# Patient Record
Sex: Male | Born: 1955 | Race: White | Hispanic: No | Marital: Married | Smoking: Former smoker
Health system: Southern US, Community
[De-identification: ages and names within clinical notes are randomized; demographics above are authoritative.]

## PROBLEM LIST (undated history)

## (undated) DIAGNOSIS — K219 Gastro-esophageal reflux disease without esophagitis: Secondary | ICD-10-CM

## (undated) DIAGNOSIS — G4730 Sleep apnea, unspecified: Secondary | ICD-10-CM

## (undated) DIAGNOSIS — I10 Essential (primary) hypertension: Secondary | ICD-10-CM

## (undated) DIAGNOSIS — Z9889 Other specified postprocedural states: Secondary | ICD-10-CM

## (undated) HISTORY — DX: Other specified postprocedural states: Z98.890

## (undated) HISTORY — DX: Gastro-esophageal reflux disease without esophagitis: K21.9

## (undated) HISTORY — PX: BACK SURGERY: SHX140

## (undated) HISTORY — PX: LUMBAR DISC SURGERY: SHX700

## (undated) HISTORY — PX: LUMBAR FUSION: SHX111

## (undated) HISTORY — DX: Sleep apnea, unspecified: G47.30

## (undated) HISTORY — DX: Essential (primary) hypertension: I10

---

## 2000-08-01 HISTORY — PX: RIB FRACTURE SURGERY: SHX2358

## 2010-08-01 DIAGNOSIS — G4730 Sleep apnea, unspecified: Secondary | ICD-10-CM

## 2010-08-01 HISTORY — DX: Sleep apnea, unspecified: G47.30

## 2010-11-18 ENCOUNTER — Other Ambulatory Visit: Payer: Self-pay | Admitting: Internal Medicine

## 2010-11-18 ENCOUNTER — Ambulatory Visit
Admission: RE | Admit: 2010-11-18 | Discharge: 2010-11-18 | Disposition: A | Discharge status: Home / Self Care | Payer: Worker's Compensation | Source: Ambulatory Visit | Attending: Internal Medicine | Admitting: Internal Medicine

## 2010-11-18 DIAGNOSIS — R0789 Other chest pain: Secondary | ICD-10-CM

## 2010-11-18 MED ORDER — IOHEXOL 300 MG/ML  SOLN: 125.0000 mL | Freq: Once | INTRAMUSCULAR | Status: AC | PRN

## 2014-12-25 HISTORY — PX: ROTATOR CUFF REPAIR: SHX139

## 2015-02-04 ENCOUNTER — Ambulatory Visit: Payer: Worker's Compensation | Attending: Orthopaedic Surgery | Admitting: Physical Therapy

## 2015-02-04 DIAGNOSIS — M25511 Pain in right shoulder: Secondary | ICD-10-CM | POA: Insufficient documentation

## 2015-02-04 DIAGNOSIS — M25611 Stiffness of right shoulder, not elsewhere classified: Secondary | ICD-10-CM | POA: Diagnosis not present

## 2015-02-04 NOTE — Therapy (Signed)
[facility] [address] [place], [place], [address] Phone: [phone]   Fax:  [phone]  Physical Therapy Evaluation  Patient Details  Name: Jeffrey Logan MRN: [id number] Date of Birth: 08/02/1955 Referring Provider:  Marcene Corningalldorf, Peter, MD  Encounter Date: 02/04/2015      PT End of Session - 02/04/15 1135    Visit Number 1   Number of Visits 18   Date for PT Re-Evaluation 04/01/15   PT Start Time 0858   PT Stop Time 0943   PT Time Calculation (min) 45 min   Activity Tolerance Patient tolerated treatment well   Behavior During Therapy [facility] for tasks assessed/performed      No past medical history on file.  No past surgical history on file.  There were no vitals filed for this visit.  Visit Diagnosis:  Right shoulder pain - Plan: PT plan of care cert/re-cert  Shoulder stiffness, right - Plan: PT plan of care cert/re-cert      Subjective Assessment - 02/04/15 1117    Subjective I know this will be a long process.   Limitations Lifting   Patient Stated Goals Return right shoulder to previous level of function.   Currently in Pain? Yes   Pain Score 6    Pain Location Shoulder   Pain Orientation Right   Pain Descriptors / Indicators Aching;Constant   Pain Type Surgical pain   Pain Onset More than a month ago   Pain Frequency Constant   Aggravating Factors  Moving right UE.   Pain Relieving Factors Not moving right UE.   Multiple Pain Sites No            [facility] PT Assessment - 02/04/15 0001    Assessment   Medical Diagnosis Right shoulder RCR.   Onset Date/Surgical Date --  12/25/14.   Precautions   Precaution Comments Begin with gentle right shoulder range of motion.   Restrictions   Weight Bearing Restrictions No   Balance Screen   Has the patient fallen in the past 6 months No   Has the patient had a decrease in activity level because of a fear of falling?  No   Is the patient reluctant to leave their home  because of a fear of falling?  No   Home [employer]   Living Environment Private residence   Prior Function   Level of Independence Independent   Posture/Postural Control   Posture Comments Guarded posture.   ROM / Strength   AROM / PROM / Strength AROM   AROM   Overall AROM Comments Passive-assisitve right shoulder flexion= 90 degrees and ER= 50 degrees.   Palpation   Palpation comment --  Patient c/o diffuse right shoulder pain.                   [facility] - 02/04/15 0001    Modalities   Modalities Electrical Stimulation   Electrical Stimulation   Electrical Stimulation Location --  Right shoulder.   Electrical Stimulation Action --  80-150 HZ at 100% scan--Non-motoric x 15 minutes.   Electrical Stimulation Goals Pain                     PT Long Term Goals - 02/04/15 1143    PT LONG TERM GOAL #1   Title Ind with a HEP.   Time 6   Period Weeks   Status New   PT LONG TERM GOAL #2   Title Active  right shoulder flexion to 155 degrees+.   Time 6   Period Weeks   Status New   PT LONG TERM GOAL #3   Title Active ER to 80 degrees.   Time 6   Period Weeks   Status New   PT LONG TERM GOAL #4   Title Patient reach behind back to L2 with right hand.   Time 6   Period Weeks   Status New   PT LONG TERM GOAL #5   Title Right shoulder strength= 5/5.   Time 6   Period Weeks   Status New   Additional Long Term Goals   Additional Long Term Goals Yes   PT LONG TERM GOAL #6   Title Patient able to perform ADL's with right shoulder pain not > 3/10.               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  He underwent a right RCR repair surgery on 12/25/14.  He is still complaint to his sling usage.  He is doing the pendulum exercise at home.  The patient's pain-level is rated at a 5-6/10 and higher (7+/10) with movement out of  sling.   Pt will benefit from skilled therapeutic intervention in order to improve on the following deficits Pain;Decreased activity tolerance;Decreased range of motion;Decreased strength   Rehab Potential Excellent   PT Frequency 3x / week   PT Duration 6 weeks   PT Treatment/Interventions ADLs/Self Care Home Management;Cryotherapy;Electrical Stimulation;Moist Heat;Ultrasound;Therapeutic activities;Therapeutic exercise;Neuromuscular re-education;Patient/family education;Manual techniques;Passive range of motion   PT Next Visit Plan Begin with P and Passive-assisitve ROM to patient's right shoulder to restore full right shoulder ROM.  Modalties and STM PRN.         Problem List There are no active problems to display for this patient.   Eddrick Dilone, [place] MPT 02/04/2015, 11:50 AM  [facility] [address] [place], [place], [address] Phone: [phone]   Fax:  [phone]

## 2015-02-06 ENCOUNTER — Ambulatory Visit: Payer: Worker's Compensation | Admitting: Physical Therapy

## 2015-02-06 DIAGNOSIS — M25511 Pain in right shoulder: Secondary | ICD-10-CM

## 2015-02-06 DIAGNOSIS — M25611 Stiffness of right shoulder, not elsewhere classified: Secondary | ICD-10-CM

## 2015-02-06 NOTE — Therapy (Signed)
[facility] [address] [place], [place], [address] Phone: [phone]   Fax:  [phone]  Physical Therapy Treatment  Patient Details  Name: Meril Dray MRN: [id number] Date of Birth: 07/03/1956 Referring Provider:  Marcene Corning, MD  Encounter Date: 02/06/2015      PT End of Session - 02/06/15 0931    Visit Number 2   Number of Visits 18   Date for PT Re-Evaluation 04/01/15   PT Start Time 0903   PT Stop Time 0943   PT Time Calculation (min) 40 min   Equipment Utilized During Treatment Other (comment)  R shoulder sling   Activity Tolerance Patient limited by pain   Behavior During Therapy [facility] for tasks assessed/performed      No past medical history on file.  No past surgical history on file.  There were no vitals filed for this visit.  Visit Diagnosis:  Right shoulder pain  Shoulder stiffness, right      Subjective Assessment - 02/06/15 0932    Subjective Reports some pain that goes into his neck at a constant rate he says. States that the stim machine made his shoulder sore the following day after evaluation. Took pain meds prior to therapy. States that sometimes he takes his arm out of the sling for a little while and lets RUE just lay due to sling making his shoulder hurt sometimes.   Patient Stated Goals Return right shoulder to previous level of function.   Currently in Pain? Yes   Pain Score 5    Pain Location Shoulder   Pain Orientation Right   Pain Descriptors / Indicators Sore   Pain Type Surgical pain   Pain Onset More than a month ago   Pain Frequency Constant            [facility] PT Assessment - 02/06/15 0001    Assessment   Medical Diagnosis Right shoulder RCR.   Onset Date/Surgical Date 12/25/14   Next MD Visit 02/27/2015                     [facility] Adult PT Treatment/Exercise - 02/06/15 0001    Modalities   Modalities Electrical Stimulation;Vasopneumatic   Electrical Stimulation    Electrical Stimulation Location R shoulder   Electrical Stimulation Action IFC   Electrical Stimulation Parameters 1-10 Hz x15 min   Electrical Stimulation Goals Pain   Vasopneumatic   Number Minutes Vasopneumatic  15 minutes   Vasopnuematic Location  Shoulder   Vasopneumatic Pressure Medium   Vasopneumatic Temperature  48   Manual Therapy   Manual Therapy Passive ROM   Passive ROM R shoulder into flex/scap/ER/IR with gentle holds at end range                     PT Long Term Goals - 02/04/15 1143    PT LONG TERM GOAL #1   Title Ind with a HEP.   Time 6   Period Weeks   Status New   PT LONG TERM GOAL #2   Title Active right shoulder flexion to 155 degrees+.   Time 6   Period Weeks   Status New   PT LONG TERM GOAL #3   Title Active ER to 80 degrees.   Time 6   Period Weeks   Status New   PT LONG TERM GOAL #4   Title Patient reach behind back to L2 with right hand.   Time 6   Period Weeks  Status New   PT LONG TERM GOAL #5   Title Right shoulder strength= 5/5.   Time 6   Period Weeks   Status New   Additional Long Term Goals   Additional Long Term Goals Yes   PT LONG TERM GOAL #6   Title Patient able to perform ADL's with right shoulder pain not > 3/10.               Plan - 02/06/15 0937    Clinical Impression Statement The patient did fairly well with PROM treatment today but had pain at end range and continues to have pain on descent during PROM. All goals remain on-going secondary to PROM treatment limitation. Firm end feels noted during PROM of the R shoulder but occassionally had empty end feels. Oscillation used frequently during manual therapy. Stim/vaso were terminated after 11 minutes secondary to patient request in what he reported as discomfort with the medium setting with vasopneumatic. E-stim was set non-motoric to where patient first felt tingle sensation. Experienced 8/10 soreness following treatment,   Pt will benefit from  skilled therapeutic intervention in order to improve on the following deficits Pain;Decreased activity tolerance;Decreased range of motion;Decreased strength   Rehab Potential Excellent   PT Frequency 3x / week   PT Duration 6 weeks   PT Treatment/Interventions ADLs/Self Care Home Management;Cryotherapy;Electrical Stimulation;Moist Heat;Ultrasound;Therapeutic activities;Therapeutic exercise;Neuromuscular re-education;Patient/family education;Manual techniques;Passive range of motion   PT Next Visit Plan Continue per PROM per MPT POC.   Consulted and Agree with Plan of Care Patient        Problem List There are no active problems to display for this patient.   Evelene CroonKelsey M Parsons, PTA 02/06/2015, 10:02 AM  [facility] [address] [place], [place], [address] Phone: [phone]   Fax:  [phone]

## 2015-02-09 ENCOUNTER — Encounter: Payer: Self-pay | Admitting: Physical Therapy

## 2015-02-09 ENCOUNTER — Ambulatory Visit: Payer: Worker's Compensation | Admitting: Physical Therapy

## 2015-02-09 DIAGNOSIS — M25511 Pain in right shoulder: Secondary | ICD-10-CM | POA: Diagnosis not present

## 2015-02-09 DIAGNOSIS — M25611 Stiffness of right shoulder, not elsewhere classified: Secondary | ICD-10-CM

## 2015-02-09 NOTE — Therapy (Signed)
[facility] [address] [place], [place], [address] Phone: [phone]   Fax:  [phone]  Physical Therapy Treatment  Patient Details  Name: Jeffrey Logan MRN: [id number] Date of Birth: July 03, 1956 Referring Provider:  Marcene Corning, MD  Encounter Date: 02/09/2015      PT End of Session - 02/09/15 1011    Visit Number 3   Number of Visits 18   Date for PT Re-Evaluation 04/01/15   PT Start Time 0945   PT Stop Time 1032   PT Time Calculation (min) 47 min   Activity Tolerance Patient tolerated treatment well   Behavior During Therapy [facility] for tasks assessed/performed      History reviewed. No pertinent past medical history.  History reviewed. No pertinent past surgical history.  There were no vitals filed for this visit.  Visit Diagnosis:  Right shoulder pain  Shoulder stiffness, right      Subjective Assessment - 02/09/15 0957    Subjective sore the past few days up to 8/10 and has been having difficulty taking shower and used right UE   Limitations Lifting   Patient Stated Goals Return right shoulder to previous level of function.   Currently in Pain? Yes   Pain Score 7    Pain Location Shoulder   Pain Orientation Right   Pain Descriptors / Indicators Sore   Pain Type Surgical pain   Pain Onset More than a month ago   Pain Frequency Constant   Aggravating Factors  moving right arm   Pain Relieving Factors rest            [facility] PT Assessment - 02/09/15 0001    ROM / Strength   AROM / PROM / Strength PROM   PROM   Overall PROM  Deficits   PROM Assessment Site Shoulder   Right/Left Shoulder Right   Right Shoulder Flexion 114 Degrees   Right Shoulder External Rotation 62 Degrees                     [facility] Adult PT Treatment/Exercise - 02/09/15 0001    Electrical Stimulation   Electrical Stimulation Location R shoulder   Electrical Stimulation Action IFC   Electrical Stimulation Parameters  1-10hz    Electrical Stimulation Goals Pain   Vasopneumatic   Number Minutes Vasopneumatic  15 minutes   Vasopnuematic Location  Shoulder   Vasopneumatic Pressure Low   Manual Therapy   Manual Therapy Passive ROM   Passive ROM R shoulder into flex/scap/ER/IR with gentle holds at end range                     PT Long Term Goals - 02/04/15 1143    PT LONG TERM GOAL #1   Title Ind with a HEP.   Time 6   Period Weeks   Status New   PT LONG TERM GOAL #2   Title Active right shoulder flexion to 155 degrees+.   Time 6   Period Weeks   Status New   PT LONG TERM GOAL #3   Title Active ER to 80 degrees.   Time 6   Period Weeks   Status New   PT LONG TERM GOAL #4   Title Patient reach behind back to L2 with right hand.   Time 6   Period Weeks   Status New   PT LONG TERM GOAL #5   Title Right shoulder strength= 5/5.   Time 6   Period Weeks  Status New   Additional Long Term Goals   Additional Long Term Goals Yes   PT LONG TERM GOAL #6   Title Patient able to perform ADL's with right shoulder pain not > 3/10.               Plan - 02/09/15 1025    Clinical Impression Statement Patient progressing with PROM today. Patient reported using arm with showering. Patient continues to wear sling and is unsure of when MD wants him to DC sling. Goals ongoing due to protocol limitations.    Pt will benefit from skilled therapeutic intervention in order to improve on the following deficits Pain;Decreased activity tolerance;Decreased range of motion;Decreased strength   Rehab Potential Excellent   Clinical Impairments Affecting Rehab Potential surgury 12/25/14 current 6 weeks 02/05/15   PT Frequency 3x / week   PT Duration 6 weeks   PT Treatment/Interventions ADLs/Self Care Home Management;Cryotherapy;Electrical Stimulation;Moist Heat;Ultrasound;Therapeutic activities;Therapeutic exercise;Neuromuscular re-education;Patient/family education;Manual techniques;Passive range of  motion   PT Next Visit Plan Continue per PROM per MPT POC. (MD Yisroel Rammingaldorf 02/25/15)   Consulted and Agree with Plan of Care Patient        Problem List There are no active problems to display for this patient.   Hermelinda DellenDUNFORD, Monicka Cyran P, PTA 02/09/2015, 10:39 AM  [facility] [address] [place], [place], [id number] Phone: [phone]   Fax:  [phone]

## 2015-02-11 ENCOUNTER — Ambulatory Visit: Payer: Worker's Compensation | Admitting: Physical Therapy

## 2015-02-11 ENCOUNTER — Encounter: Payer: Self-pay | Admitting: Physical Therapy

## 2015-02-11 DIAGNOSIS — M25511 Pain in right shoulder: Secondary | ICD-10-CM | POA: Diagnosis not present

## 2015-02-11 DIAGNOSIS — M25611 Stiffness of right shoulder, not elsewhere classified: Secondary | ICD-10-CM

## 2015-02-11 NOTE — Therapy (Signed)
[facility] [address] [place], [place], [address] Phone: [phone]   Fax:  [phone]  Physical Therapy Treatment  Patient Details  Name: Jeffrey Logan MRN: [id number] Date of Birth: 02/18/1956 Referring Provider:  Marcene Corningalldorf, Peter, MD  Encounter Date: 02/11/2015      PT End of Session - 02/11/15 1010    Visit Number 4   Number of Visits 18   Date for PT Re-Evaluation 04/01/15   PT Start Time 0945   PT Stop Time 1025   PT Time Calculation (min) 40 min   Activity Tolerance Patient tolerated treatment well   Behavior During Therapy [facility] for tasks assessed/performed      History reviewed. No pertinent past medical history.  History reviewed. No pertinent past surgical history.  There were no vitals filed for this visit.  Visit Diagnosis:  Right shoulder pain  Shoulder stiffness, right      Subjective Assessment - 02/11/15 0949    Subjective ache in right shoulder today   Limitations Lifting   Patient Stated Goals Return right shoulder to previous level of function.   Currently in Pain? Yes   Pain Score 7    Pain Location Shoulder   Pain Orientation Right   Pain Descriptors / Indicators Sore   Pain Type Surgical pain   Pain Onset More than a month ago   Pain Frequency Constant   Aggravating Factors  moving arm   Pain Relieving Factors rest            [facility] PT Assessment - 02/11/15 0001    PROM   Overall PROM  Deficits   PROM Assessment Site Shoulder   Right/Left Shoulder Right   Right Shoulder External Rotation 61 Degrees                     [facility] Adult PT Treatment/Exercise - 02/11/15 0001    Electrical Stimulation   Electrical Stimulation Location R shoulder   Electrical Stimulation Action premod non motoric   Electrical Stimulation Parameters 1-10HZ    Electrical Stimulation Goals Pain   Vasopneumatic   Number Minutes Vasopneumatic  15 minutes   Vasopnuematic Location  Shoulder    Vasopneumatic Pressure Low   Manual Therapy   Manual Therapy Passive ROM   Passive ROM R shoulder into flex/scap/ER/IR with gentle holds at end range                     PT Long Term Goals - 02/04/15 1143    PT LONG TERM GOAL #1   Title Ind with a HEP.   Time 6   Period Weeks   Status New   PT LONG TERM GOAL #2   Title Active right shoulder flexion to 155 degrees+.   Time 6   Period Weeks   Status New   PT LONG TERM GOAL #3   Title Active ER to 80 degrees.   Time 6   Period Weeks   Status New   PT LONG TERM GOAL #4   Title Patient reach behind back to L2 with right hand.   Time 6   Period Weeks   Status New   PT LONG TERM GOAL #5   Title Right shoulder strength= 5/5.   Time 6   Period Weeks   Status New   Additional Long Term Goals   Additional Long Term Goals Yes   PT LONG TERM GOAL #6   Title Patient able to perform ADL's with  right shoulder pain not > 3/10.               Plan - 02/11/15 1011    Clinical Impression Statement Pateint progressing with ROM slowly today. No improvemt with measurements today due to soreness. Pateint ran out of pain meds and has increased throbbing in right shoulder. Goals ongoing due to protocol limitations.   Pt will benefit from skilled therapeutic intervention in order to improve on the following deficits Pain;Decreased activity tolerance;Decreased range of motion;Decreased strength   Rehab Potential Excellent   Clinical Impairments Affecting Rehab Potential surgury 12/25/14 current 6 weeks 02/05/15   PT Frequency 3x / week   PT Duration 6 weeks   PT Treatment/Interventions ADLs/Self Care Home Management;Cryotherapy;Electrical Stimulation;Moist Heat;Ultrasound;Therapeutic activities;Therapeutic exercise;Neuromuscular re-education;Patient/family education;Manual techniques;Passive range of motion   PT Next Visit Plan Continue per PROM per MPT POC. (MD [name] 02/25/15)   Consulted and Agree with Plan of Care Patient         Problem List There are no active problems to display for this patient.   [name], PTA 02/11/2015, 10:27 AM  [facility] [address] [place], [place], [address] Phone: [phone]   Fax:  [phone]

## 2015-02-13 ENCOUNTER — Ambulatory Visit: Payer: Worker's Compensation | Admitting: Physical Therapy

## 2015-02-13 DIAGNOSIS — M25611 Stiffness of right shoulder, not elsewhere classified: Secondary | ICD-10-CM

## 2015-02-13 DIAGNOSIS — M25511 Pain in right shoulder: Secondary | ICD-10-CM | POA: Diagnosis not present

## 2015-02-13 NOTE — Therapy (Signed)
[facility] [address] [place], [place], [address] Phone: [phone]   Fax:  [phone]  Physical Therapy Treatment  Patient Details  Name: Jeffrey Logan MRN: [id number] Date of Birth: 27-Jan-1956 Referring Provider:  Marcene Corning, MD  Encounter Date: 02/13/2015      PT End of Session - 02/13/15 0948    Visit Number 5   Number of Visits 18   Date for PT Re-Evaluation 04/01/15   PT Start Time 0945   PT Stop Time 1043   PT Time Calculation (min) 58 min   Activity Tolerance Patient tolerated treatment well   Behavior During Therapy [facility] for tasks assessed/performed      No past medical history on file.  No past surgical history on file.  There were no vitals filed for this visit.  Visit Diagnosis:  Right shoulder pain  Shoulder stiffness, right      Subjective Assessment - 02/13/15 0950    Subjective Always bothering me. Patient reports difficulty sleeping.   Currently in Pain? Yes   Pain Score 6    Pain Location Shoulder   Pain Orientation Right   Pain Descriptors / Indicators Throbbing   Pain Type Surgical pain   Aggravating Factors  moving arm   Pain Relieving Factors rest   Effect of Pain on Daily Activities diffictulty sleeping            [facility] PT Assessment - 02/13/15 0001    Assessment   Medical Diagnosis Right shoulder RCR.   Onset Date/Surgical Date 12/25/14   Next MD Visit 02/27/2015   PROM   PROM Assessment Site Shoulder   Right/Left Shoulder Right   Right Shoulder Flexion 125 Degrees   Right Shoulder External Rotation 49 Degrees   Flexibility   Soft Tissue Assessment /Muscle Length --  marked tightness in Rt teres minor/major                     [facility] Adult PT Treatment/Exercise - 02/13/15 0001    Electrical Stimulation   Electrical Stimulation Location R shoulder   Electrical Stimulation Action IFC   Electrical Stimulation Parameters 80-150 HZ   Electrical Stimulation Goals  Pain   Vasopneumatic   Number Minutes Vasopneumatic  15 minutes   Vasopnuematic Location  Shoulder   Vasopneumatic Pressure Medium   Manual Therapy   Manual Therapy Soft tissue mobilization;Myofascial release;Passive ROM   Soft tissue mobilization Rt post cuff   Myofascial Release TPR to post cuff   Passive ROM R shoulder into flex/scap/ER/IR with gentle holds at end range                     PT Long Term Goals - 02/13/15 1247    PT LONG TERM GOAL #1   Title Ind with a HEP.   Time 6   Period Weeks   Status On-going   PT LONG TERM GOAL #2   Title Active right shoulder flexion to 155 degrees+.   Time 6   Period Weeks   Status On-going   PT LONG TERM GOAL #3   Title Active ER to 80 degrees.   Time 6   Period Weeks   Status On-going   PT LONG TERM GOAL #4   Title Patient reach behind back to L2 with right hand.   Time 6   Period Weeks   Status On-going   PT LONG TERM GOAL #5   Title Right shoulder strength= 5/5.   Period  Weeks   Status On-going   PT LONG TERM GOAL #6   Title Patient able to perform ADL's with right shoulder pain not > 3/10.   Time 6   Period Weeks   Status On-going               Plan - 02/13/15 1246    Clinical Impression Statement Patient showed decreased ROM in ER today. He was limited by pain. He has active TPs and increased tone in the Teres muscles which improved after manual therapy. He has gained PROM in flexion.   PT Next Visit Plan Continue per PROM per MPT POC. (MD [facility] 02/25/15)        Problem List There are no active problems to display for this patient.  Solon PalmJulie Jaser Fullen PT  02/13/2015, 12:49 PM  [facility] [address] [place], [place], [address] Phone: [phone]   Fax:  [phone]

## 2015-02-16 ENCOUNTER — Ambulatory Visit: Payer: Worker's Compensation | Admitting: Physical Therapy

## 2015-02-16 DIAGNOSIS — M25611 Stiffness of right shoulder, not elsewhere classified: Secondary | ICD-10-CM

## 2015-02-16 DIAGNOSIS — M25511 Pain in right shoulder: Secondary | ICD-10-CM | POA: Diagnosis not present

## 2015-02-16 NOTE — Therapy (Signed)
[facility] [address] [place], [place], [address] Phone: [phone]   Fax:  [phone]  Physical Therapy Treatment  Patient Details  Name: Jeffrey Logan MRN: [id number] Date of Birth: 1956/06/15 Referring Provider:  Marcene Corning, MD  Encounter Date: 02/16/2015      PT End of Session - 02/16/15 1516    Visit Number 6   Number of Visits 18   Date for PT Re-Evaluation 04/01/15   PT Start Time 1515   PT Stop Time 1559   PT Time Calculation (min) 44 min      No past medical history on file.  No past surgical history on file.  There were no vitals filed for this visit.  Visit Diagnosis:  Right shoulder pain  Shoulder stiffness, right      Subjective Assessment - 02/16/15 1515    Subjective Reports that he had increased pain yesterday. Has not been doing much today so he doesn't have much pain.   Limitations Lifting   Patient Stated Goals Return right shoulder to previous level of function.   Currently in Pain? Yes   Pain Score 5    Pain Location Shoulder   Pain Orientation Right   Pain Descriptors / Indicators Throbbing   Pain Type Surgical pain   Pain Onset More than a month ago   Pain Frequency Intermittent            [facility] PT Assessment - 02/16/15 0001    Assessment   Medical Diagnosis Right shoulder RCR.   Onset Date/Surgical Date 12/25/14   Next MD Visit 02/27/2015                     [facility] Adult PT Treatment/Exercise - 02/16/15 0001    Modalities   Modalities Electrical Stimulation;Vasopneumatic   Electrical Stimulation   Electrical Stimulation Location R shoulder   Electrical Stimulation Action IFC    Electrical Stimulation Parameters 1-10 Hz x15 min   Electrical Stimulation Goals Pain   Vasopneumatic   Number Minutes Vasopneumatic  15 minutes   Vasopnuematic Location  Shoulder   Vasopneumatic Pressure Low   Manual Therapy   Manual Therapy Passive ROM   Passive ROM R shoulder into  flex/scap/ER/IR with gentle holds at end range                     PT Long Term Goals - 02/13/15 1247    PT LONG TERM GOAL #1   Title Ind with a HEP.   Time 6   Period Weeks   Status On-going   PT LONG TERM GOAL #2   Title Active right shoulder flexion to 155 degrees+.   Time 6   Period Weeks   Status On-going   PT LONG TERM GOAL #3   Title Active ER to 80 degrees.   Time 6   Period Weeks   Status On-going   PT LONG TERM GOAL #4   Title Patient reach behind back to L2 with right hand.   Time 6   Period Weeks   Status On-going   PT LONG TERM GOAL #5   Title Right shoulder strength= 5/5.   Period Weeks   Status On-going   PT LONG TERM GOAL #6   Title Patient able to perform ADL's with right shoulder pain not > 3/10.   Time 6   Period Weeks   Status On-going  Plan - 02/16/15 1556    Clinical Impression Statement Patient did fairly well during PROM but continues to have pain following several minutes of motion into each direction and catching. Oscillations were conducted to RUE to relax the R shoulder and to assist in decreasing pain. Palpated the R teres region that had increased tone in previous treatment but TPR was not needed today secondary to normal tone palpated. Normal modalties response noted following removal of the modalities. No goals were achieved today secondary to decreased ROM, strength, and increased pain. Reported soreness following treatment but no numerical rating for pain if pain was experienced.   Pt will benefit from skilled therapeutic intervention in order to improve on the following deficits Pain;Decreased activity tolerance;Decreased range of motion;Decreased strength   Rehab Potential Excellent   Clinical Impairments Affecting Rehab Potential surgury 12/25/14 current 6 weeks 02/05/15   PT Frequency 3x / week   PT Duration 6 weeks   PT Treatment/Interventions ADLs/Self Care Home Management;Cryotherapy;Electrical  Stimulation;Moist Heat;Ultrasound;Therapeutic activities;Therapeutic exercise;Neuromuscular re-education;Patient/family education;Manual techniques;Passive range of motion   PT Next Visit Plan Continue per PROM per MPT POC. (MD Yisroel Rammingaldorf 02/25/15)   Consulted and Agree with Plan of Care Patient        Problem List There are no active problems to display for this patient.   Evelene CroonKelsey M Parsons, PTA 02/16/2015, 4:06 PM  [facility] [address] [place], [place], [address] Phone: [phone]   Fax:  [phone]

## 2015-02-18 ENCOUNTER — Ambulatory Visit: Payer: Worker's Compensation | Admitting: Physical Therapy

## 2015-02-18 DIAGNOSIS — M25611 Stiffness of right shoulder, not elsewhere classified: Secondary | ICD-10-CM

## 2015-02-18 DIAGNOSIS — M25511 Pain in right shoulder: Secondary | ICD-10-CM

## 2015-02-18 NOTE — Therapy (Signed)
[facility] [address] [place], [place], [address] Phone: [phone]   Fax:  [phone]  Physical Therapy Treatment  Patient Details  Name: Jeffrey [place] MRN: [id number] Date of Birth: 08/19/1955 Referring Provider:  Marcene Corningalldorf, Peter, MD  Encounter Date: 02/18/2015      PT End of Session - 02/18/15 1340    Visit Number 7   Number of Visits 18   Date for PT Re-Evaluation 04/01/15   PT Start Time 0100   PT Stop Time 0149   PT Time Calculation (min) 49 min   Activity Tolerance Patient tolerated treatment well   Behavior [facility] Therapy [facility] for tasks assessed/performed      No past medical history on file.  No past surgical history on file.  There were no vitals filed for this visit.  Visit Diagnosis:  Right shoulder pain  Shoulder stiffness, right      Subjective Assessment - 02/18/15 1338    Subjective pain around a 5/10.  Going to doctor next Wednesday (02/25/15).   Pain Score 5    Pain Location Shoulder   Pain Orientation Right   Pain Descriptors / Indicators Throbbing   Pain Type Surgical pain   Pain Onset More than a month ago                         [facility] Adult [facility] Treatment/Exercise - 02/18/15 0001    Electrical Stimulation   Electrical Stimulation Location Right shoulder.   Electrical Stimulation Action IFC at 100% scan   Electrical Stimulation Parameters 80-150 HZ x 15 minutes.   Electrical Stimulation Goals Pain   Manual Therapy   Manual therapy comments PROM in supine into right shoulder flexion and ER in plane of scapula x 23 minutes.                     PT Long Term Goals - 02/13/15 1247    PT LONG TERM GOAL #1   Title Ind with a HEP.   Time 6   Period Weeks   Status On-going   PT LONG TERM GOAL #2   Title Active right shoulder flexion to 155 degrees+.   Time 6   Period Weeks   Status On-going   PT LONG TERM GOAL #3   Title Active ER to 80 degrees.   Time 6   Period Weeks   Status On-going   PT LONG TERM GOAL #4   Title Patient reach behind back to L2 with right hand.   Time 6   Period Weeks   Status On-going   PT LONG TERM GOAL #5   Title Right shoulder strength= 5/5.   Period Weeks   Status On-going   PT LONG TERM GOAL #6   Title Patient able to perform ADL's with right shoulder pain not > 3/10.   Time 6   Period Weeks   Status On-going               Problem List There are no active problems to display for this patient.   Jeffrey Logan, [place] MPT 02/18/2015, 1:53 PM  [facility] [address] [place], [address] Phone: [phone]   Fax:  [phone]

## 2015-02-20 ENCOUNTER — Ambulatory Visit: Payer: Worker's Compensation | Admitting: Physical Therapy

## 2015-02-20 DIAGNOSIS — M25511 Pain in right shoulder: Secondary | ICD-10-CM | POA: Diagnosis not present

## 2015-02-20 DIAGNOSIS — M25611 Stiffness of right shoulder, not elsewhere classified: Secondary | ICD-10-CM

## 2015-02-20 NOTE — Therapy (Signed)
[facility] [address] [place], [place], [address] Phone: [phone]   Fax:  [phone]  Physical Therapy Treatment  Patient Details  Name: Jeffrey Logan MRN: [id number] Date of Birth: [birth year] Referring Provider:  Marcene Corning, MD  Encounter Date: 02/18/2015      PT End of Session - 02/20/15 0904    Visit Number 8   Number of Visits 18   Date for PT Re-Evaluation 04/01/15   PT Start Time 0903   PT Stop Time 0947   PT Time Calculation (min) 44 min   Equipment Utilized During Treatment Other (comment)  R shoulder sling   Activity Tolerance Patient tolerated treatment well   Behavior During Therapy [facility] for tasks assessed/performed      No past medical history on file.  No past surgical history on file.  There were no vitals filed for this visit.  Visit Diagnosis:  Right shoulder pain  Shoulder stiffness, right      Subjective Assessment - 02/20/15 0903    Subjective Stated that shoulder pain kept him awake a good amount during the night (9/10) and well as this morning (9/10). Following PROM patient stated that he feels more in his neck than his shoulder.   Limitations Lifting   Patient Stated Goals Return right shoulder to previous level of function.   Currently in Pain? Yes   Pain Score 6    Pain Location Shoulder   Pain Orientation Right   Pain Descriptors / Indicators Sore   Pain Type Surgical pain   Pain Onset More than a month ago   Pain Frequency Intermittent            [facility] PT Assessment - 02/20/15 0001    Assessment   Medical Diagnosis Right shoulder RCR.   Onset Date/Surgical Date 12/25/14   Next MD Visit 02/25/2015   ROM / Strength   AROM / PROM / Strength PROM   PROM   Overall PROM  Deficits   PROM Assessment Site Shoulder   Right/Left Shoulder Right   Right Shoulder Flexion 122 Degrees   Right Shoulder Internal Rotation 59 Degrees   Right Shoulder External Rotation 55 Degrees                      [facility] Adult PT Treatment/Exercise - 02/20/15 0001    Modalities   Modalities Electrical Stimulation   Electrical Stimulation   Electrical Stimulation Location Right shoulder.   Electrical Stimulation Action IFC   Electrical Stimulation Parameters 1-10 Hz x15 min   Electrical Stimulation Goals Pain   Manual Therapy   Manual Therapy Passive ROM   Passive ROM R shoulder into flex/ER/IR with gentle holds at end range                     PT Long Term Goals - 02/13/15 1247    PT LONG TERM GOAL #1   Title Ind with a HEP.   Time 6   Period Weeks   Status On-going   PT LONG TERM GOAL #2   Title Active right shoulder flexion to 155 degrees+.   Time 6   Period Weeks   Status On-going   PT LONG TERM GOAL #3   Title Active ER to 80 degrees.   Time 6   Period Weeks   Status On-going   PT LONG TERM GOAL #4   Title Patient reach behind back to L2 with right hand.   Time 6  Period Weeks   Status On-going   PT LONG TERM GOAL #5   Title Right shoulder strength= 5/5.   Period Weeks   Status On-going   PT LONG TERM GOAL #6   Title Patient able to perform ADL's with right shoulder pain not > 3/10.   Time 6   Period Weeks   Status On-going               Plan - 02/20/15 1610    Clinical Impression Statement Patient did well today during PROM and did not report increased pain other than during attempted scaption. During R shoulder attempted PROM into scaption patient reported experiencing some pain in the posterior shoulder and scaption was terminated. Prior to modalties being applied patient pain more in his neck than shoulder. R Upper Trapezius was palpated and which indicated no abnormal tightness at the present time and minimal tightness in the R Teres region. R shoulder PROM measured as flex: 122 deg, ER 55 deg, IR 59 deg. Normal stimulation response noted following removal of the stimulation. Experienced 5-6/10 pain following  treatment.   Pt will benefit from skilled therapeutic intervention in order to improve on the following deficits Pain;Decreased activity tolerance;Decreased range of motion;Decreased strength   Rehab Potential Excellent   Clinical Impairments Affecting Rehab Potential surgury 12/25/14 current 6 weeks 02/05/15   PT Frequency 3x / week   PT Duration 6 weeks   PT Treatment/Interventions ADLs/Self Care Home Management;Cryotherapy;Electrical Stimulation;Moist Heat;Ultrasound;Therapeutic activities;Therapeutic exercise;Neuromuscular re-education;Patient/family education;Manual techniques;Passive range of motion   PT Next Visit Plan Continue gentle PROM per MPT POC. Sees Dr. [name]  02/25/2015   Consulted and Agree with Plan of Care Patient        Problem List There are no active problems to display for this patient.   [place], [place] MPT 02/20/2015, 9:57 AM  [facility] [address] [place], [place], [address] Phone: [phone]   Fax:  [phone]

## 2015-02-20 NOTE — Therapy (Signed)
[facility] [address] [place], [place], [address] Phone: [phone]   Fax:  [phone]  Physical Therapy Treatment  Patient Details  Name: Jeffrey Logan MRN: [id number] Date of Birth: May 12, 1956 Referring Provider:  Marcene Corning, MD  Encounter Date: 02/20/2015      PT End of Session - 02/20/15 0904    Visit Number 8   Number of Visits 18   Date for PT Re-Evaluation 04/01/15   PT Start Time 0903   PT Stop Time 0947   PT Time Calculation (min) 44 min   Equipment Utilized During Treatment Other (comment)  R shoulder sling   Activity Tolerance Patient tolerated treatment well   Behavior During Therapy [facility] for tasks assessed/performed      No past medical history on file.  No past surgical history on file.  There were no vitals filed for this visit.  Visit Diagnosis:  Right shoulder pain  Shoulder stiffness, right      Subjective Assessment - 02/20/15 0903    Subjective Stated that shoulder pain kept him awake a good amount during the night (9/10) and well as this morning (9/10). Following PROM patient stated that he feels more in his neck than his shoulder. Reported pain in posterior capsule during PROM R shoulder scaption and scaption was terminated. Reported getting in pool at home recently.   Limitations Lifting   Patient Stated Goals Return right shoulder to previous level of function.   Currently in Pain? Yes   Pain Score 6    Pain Location Shoulder   Pain Orientation Right   Pain Descriptors / Indicators Sore   Pain Type Surgical pain   Pain Onset More than a month ago   Pain Frequency Intermittent            [facility] PT Assessment - 02/20/15 0001    Assessment   Medical Diagnosis Right shoulder RCR.   Onset Date/Surgical Date 12/25/14   Next MD Visit 02/25/2015   ROM / Strength   AROM / PROM / Strength PROM   PROM   Overall PROM  Deficits   PROM Assessment Site Shoulder   Right/Left Shoulder Right   Right Shoulder Flexion 122 Degrees   Right Shoulder Internal Rotation 59 Degrees   Right Shoulder External Rotation 55 Degrees                     [facility] Adult PT Treatment/Exercise - 02/20/15 0001    Modalities   Modalities Electrical Stimulation   Electrical Stimulation   Electrical Stimulation Location Right shoulder.   Electrical Stimulation Action IFC   Electrical Stimulation Parameters 1-10 Hz x15 min   Electrical Stimulation Goals Pain   Manual Therapy   Manual Therapy Passive ROM   Passive ROM R shoulder into gentle flex/ER/IR with gentle holds at end range                     PT Long Term Goals - 02/13/15 1247    PT LONG TERM GOAL #1   Title Ind with a HEP.   Time 6   Period Weeks   Status On-going   PT LONG TERM GOAL #2   Title Active right shoulder flexion to 155 degrees+.   Time 6   Period Weeks   Status On-going   PT LONG TERM GOAL #3   Title Active ER to 80 degrees.   Time 6   Period Weeks   Status On-going  PT LONG TERM GOAL #4   Title Patient reach behind back to L2 with right hand.   Time 6   Period Weeks   Status On-going   PT LONG TERM GOAL #5   Title Right shoulder strength= 5/5.   Period Weeks   Status On-going   PT LONG TERM GOAL #6   Title Patient able to perform ADL's with right shoulder pain not > 3/10.   Time 6   Period Weeks   Status On-going               Plan - 02/20/15 8119    Clinical Impression Statement Patient did well today during PROM and did not report increased pain other than during attempted scaption with no facial wincing other than during scaption. Firm end feels noted in all directions during R shoulder gentle PROM.During R shoulder attempted PROM into scaption patient reported experiencing some pain in the posterior shoulder and scaption was terminated. Prior to modalties being applied patient pain more in his neck than shoulder. R Upper Trapezius was palpated and which indicated no  abnormal tightness at the present time and minimal tightness in the R Teres region. R shoulder PROM measured as flex: 122 deg, ER 55 deg, IR 59 deg. Normal stimulation response noted following removal of the stimulation. Experienced 5-6/10 pain following treatment. Ambulated into therapy gym with only wrist through sling the elbow was outside the sling.   Pt will benefit from skilled therapeutic intervention in order to improve on the following deficits Pain;Decreased activity tolerance;Decreased range of motion;Decreased strength   Rehab Potential Excellent   Clinical Impairments Affecting Rehab Potential surgury 12/25/14 current 6 weeks 02/05/15   PT Frequency 3x / week   PT Duration 6 weeks   PT Treatment/Interventions ADLs/Self Care Home Management;Cryotherapy;Electrical Stimulation;Moist Heat;Ultrasound;Therapeutic activities;Therapeutic exercise;Neuromuscular re-education;Patient/family education;Manual techniques;Passive range of motion   PT Next Visit Plan Continue gentle PROM per MPT POC. Sees Dr. [name]  02/25/2015   Consulted and Agree with Plan of Care Patient        Problem List There are no active problems to display for this patient.   Florence Canner, PTA 02/20/2015 10:21 AM  [facility] [address] [place], [place], [address] Phone: [phone]   Fax:  [phone]

## 2015-02-24 ENCOUNTER — Ambulatory Visit: Payer: Worker's Compensation | Admitting: *Deleted

## 2015-02-24 ENCOUNTER — Encounter: Payer: Self-pay | Admitting: *Deleted

## 2015-02-24 DIAGNOSIS — M25611 Stiffness of right shoulder, not elsewhere classified: Secondary | ICD-10-CM

## 2015-02-24 DIAGNOSIS — M25511 Pain in right shoulder: Secondary | ICD-10-CM | POA: Diagnosis not present

## 2015-02-24 NOTE — Therapy (Signed)
[facility] [address] [place], [place], [address] Phone: [phone]   Fax:  [phone]  Physical Therapy Treatment  Patient Details  Name: Jeffrey Logan MRN: [id number] Date of Birth: 1956/01/03 Referring Provider:  Marcene Corning, MD  Encounter Date: 02/24/2015      PT End of Session - 02/24/15 1022    Visit Number 9   Number of Visits 18   Date for PT Re-Evaluation 04/01/15   PT Start Time 0945   PT Stop Time 1034   PT Time Calculation (min) 49 min      History reviewed. No pertinent past medical history.  History reviewed. No pertinent past surgical history.  There were no vitals filed for this visit.  Visit Diagnosis:  Right shoulder pain  Shoulder stiffness, right      Subjective Assessment - 02/24/15 0954    Subjective RT shldr 8/10 today   Patient Stated Goals Return right shoulder to previous level of function.   Currently in Pain? Yes   Pain Score 8    Pain Location Shoulder   Pain Orientation Right   Pain Descriptors / Indicators Sore   Pain Type Surgical pain   Pain Frequency Intermittent   Aggravating Factors  moving RT arm   Pain Relieving Factors rest            [facility] PT Assessment - 02/24/15 0001    PROM   Overall PROM  Deficits   PROM Assessment Site Shoulder   Right/Left Shoulder Right   Right Shoulder Flexion 100 Degrees  only 100 today due to pain. 122 last week   Right Shoulder Internal Rotation 75 Degrees  supine scaption   Right Shoulder External Rotation 70 Degrees  in scaption supine                     [facility] Adult PT Treatment/Exercise - 02/24/15 0001    Modalities   Modalities Electrical Stimulation   Electrical Stimulation   Electrical Stimulation Location Right shoulder.   Electrical Stimulation Action IFC   Electrical Stimulation Parameters 1-10hz  x15 min   Electrical Stimulation Goals Pain   Manual Therapy   Manual Therapy Passive ROM   Passive ROM R  shoulder into gentle flex/ER/IR with gentle holds at end range all with pt supine                     PT Long Term Goals - 02/13/15 1247    PT LONG TERM GOAL #1   Title Ind with a HEP.   Time 6   Period Weeks   Status On-going   PT LONG TERM GOAL #2   Title Active right shoulder flexion to 155 degrees+.   Time 6   Period Weeks   Status On-going   PT LONG TERM GOAL #3   Title Active ER to 80 degrees.   Time 6   Period Weeks   Status On-going   PT LONG TERM GOAL #4   Title Patient reach behind back to L2 with right hand.   Time 6   Period Weeks   Status On-going   PT LONG TERM GOAL #5   Title Right shoulder strength= 5/5.   Period Weeks   Status On-going   PT LONG TERM GOAL #6   Title Patient able to perform ADL's with right shoulder pain not > 3/10.   Time 6   Period Weeks   Status On-going  Plan - 02/24/15 1023    Clinical Impression Statement Pt did fair today with Rx. His IR and ER ROM measurements are good, but had less  ROM today with passive flexion due to pain. Pt had posteriolateral complaints of pain today during elevation ROM. Await MD orders   Rehab Potential Excellent   Clinical Impairments Affecting Rehab Potential surgury 12/25/14 current 6 weeks 02/05/15   PT Frequency 3x / week   PT Duration 6 weeks   PT Treatment/Interventions ADLs/Self Care Home Management;Cryotherapy;Electrical Stimulation;Moist Heat;Ultrasound;Therapeutic activities;Therapeutic exercise;Neuromuscular re-education;Patient/family education;Manual techniques;Passive range of motion   PT Next Visit Plan Continue gentle PROM per MPT POC. Sees Dr. [name]  02/25/2015      send MD Note. Continue to warn Pt. not to over do it at home with RT UE      Treatments have consisted of gentle range of motion thus far.  Problem List There are no active problems to display for this patient.   Waylyn Tenbrink,CHRIS, PTA 02/24/2015, 10:34 AM   [place] MPT  [facility] [address] [place], [place], [address] Phone: [phone]   Fax:  [phone]

## 2015-02-26 ENCOUNTER — Ambulatory Visit: Payer: Worker's Compensation | Admitting: *Deleted

## 2015-02-26 ENCOUNTER — Encounter: Payer: Self-pay | Admitting: *Deleted

## 2015-02-26 DIAGNOSIS — M25511 Pain in right shoulder: Secondary | ICD-10-CM | POA: Diagnosis not present

## 2015-02-26 DIAGNOSIS — M25611 Stiffness of right shoulder, not elsewhere classified: Secondary | ICD-10-CM

## 2015-02-26 NOTE — Therapy (Signed)
[facility] [address] [place], [place], [address] Phone: [phone]   Fax:  [phone]  Physical Therapy Treatment  Patient Details  Name: Jeffrey Logan MRN: [id number] Date of Birth: 10-26-1955 Referring Provider:  Marcene Corning, MD  Encounter Date: 02/26/2015      PT End of Session - 02/26/15 1026    Visit Number 10   Number of Visits 18   Date for PT Re-Evaluation 04/01/15   PT Start Time 0945   PT Stop Time 1036   PT Time Calculation (min) 51 min      History reviewed. No pertinent past medical history.  History reviewed. No pertinent past surgical history.  There were no vitals filed for this visit.  Visit Diagnosis:  Right shoulder pain  Shoulder stiffness, right      Subjective Assessment - 02/26/15 0951    Subjective Went to MD and he DC'd the sling and said I could start AAROM. Have abrasions on RT arm from chicken wire I had to move.   Limitations Lifting   Patient Stated Goals Return right shoulder to previous level of function.   Currently in Pain? Yes   Pain Score 5    Pain Location Shoulder   Pain Orientation Right   Pain Descriptors / Indicators Sore   Pain Type Surgical pain   Pain Onset More than a month ago   Pain Frequency Intermittent   Aggravating Factors  movingRT arm                         [facility] Adult PT Treatment/Exercise - 02/26/15 0001    Exercises   Exercises Shoulder   Shoulder Exercises: Supine   Other Supine Exercises supine cane press and flexion 3x10   Shoulder Exercises: Pulleys   Flexion 3 minutes  seated   Electrical Stimulation   Electrical Stimulation Location Right shoulder.   Electrical Stimulation Action IFC   Electrical Stimulation Parameters 1-10 hz   Electrical Stimulation Goals Pain   Manual Therapy   Manual Therapy Passive ROM   Manual therapy comments Gentle PROM in supine into right shoulder flexion and ER in plane of scapula x 23 minutes.    Soft tissue mobilization Rt post cuff   Passive ROM R shoulder into gentle flex/ER/IR with gentle holds at end range. Rhythmic stabs for ER/IR all with pt supine                PT Education - 02/26/15 1025    Education provided Yes   Education Details supine cane press and flexion   Person(s) Educated Patient   Methods Explanation;Demonstration;Tactile cues;Verbal cues;Handout   Comprehension Verbalized understanding;Returned demonstration             PT Long Term Goals - 02/13/15 1247    PT LONG TERM GOAL #1   Title Ind with a HEP.   Time 6   Period Weeks   Status On-going   PT LONG TERM GOAL #2   Title Active right shoulder flexion to 155 degrees+.   Time 6   Period Weeks   Status On-going   PT LONG TERM GOAL #3   Title Active ER to 80 degrees.   Time 6   Period Weeks   Status On-going   PT LONG TERM GOAL #4   Title Patient reach behind back to L2 with right hand.   Time 6   Period Weeks   Status On-going   PT LONG  TERM GOAL #5   Title Right shoulder strength= 5/5.   Period Weeks   Status On-going   PT LONG TERM GOAL #6   Title Patient able to perform ADL's with right shoulder pain not > 3/10.   Time 6   Period Weeks   Status On-going               Plan - 02/26/15 1027    Clinical Impression Statement Pt did better today with PROM and was able to reach 125 degrees for flexion. He did fair with AAROM ex.'s and had no problem with Rhythmic stabilization for IR/ER.   Pt will benefit from skilled therapeutic intervention in order to improve on the following deficits Pain;Decreased activity tolerance;Decreased range of motion;Decreased strength   Rehab Potential Excellent   Clinical Impairments Affecting Rehab Potential surgury 12/25/14 current 6 weeks 02/05/15   PT Frequency 3x / week   PT Duration 6 weeks   PT Treatment/Interventions ADLs/Self Care Home Management;Cryotherapy;Electrical Stimulation;Moist Heat;Ultrasound;Therapeutic  activities;Therapeutic exercise;Neuromuscular re-education;Patient/family education;Manual techniques;Passive range of motion   PT Next Visit Plan Continue gentle PROM and AAROM as per  Dr. [name] as per Pt. . Continue to warn Pt. not to over do it at home with RT UE. Give pt pictures of HEP         Problem List There are no active problems to display for this patient.   RAMSEUR,CHRIS, PTA 02/26/2015, 10:52 AM  [facility] [address] [place], [place], [address] Phone: [phone]   Fax:  [phone]

## 2015-03-02 ENCOUNTER — Ambulatory Visit: Payer: Worker's Compensation | Attending: Orthopaedic Surgery | Admitting: Physical Therapy

## 2015-03-02 ENCOUNTER — Encounter: Payer: Self-pay | Admitting: Physical Therapy

## 2015-03-02 DIAGNOSIS — M25611 Stiffness of right shoulder, not elsewhere classified: Secondary | ICD-10-CM | POA: Diagnosis present

## 2015-03-02 DIAGNOSIS — M25511 Pain in right shoulder: Secondary | ICD-10-CM | POA: Diagnosis not present

## 2015-03-02 NOTE — Patient Instructions (Signed)
ROM: External / Internal Rotation - Wand   Holding wand with left hand palm up, push out from body with other hand, palm down. Keep both elbows bent. When stretch is felt, hold _5___ seconds. Repeat to other side, leading with same hand. Keep elbows bent. Repeat __10__ times per set. Do __2-3__ sets per session. Do __2__ sessions per day.  http://orth.exer.us/748   Copyright  VHI. All rights reserved.  ROM: Flexion - Wand (Supine)   Lie on back holding wand. Raise arms over head.  Repeat __10__ times per set. Do _2-3___ sets per session. Do _2___ sessions per day.  http://orth.exer.us/928   Copyright  VHI. All rights reserved.   

## 2015-03-02 NOTE — Therapy (Signed)
[facility] [place], [place], [address] Phone: [phone]   Fax:  [phone]  Physical Therapy Treatment  Patient Details  Name: Jeffrey Logan MRN: [id number] Date of Birth: 10-02-1955 Referring Provider:  Melrose Nakayama, MD  Encounter Date: 03/02/2015      PT End of Session - 03/02/15 1011    Visit Number 11   Number of Visits 18   Date for PT Re-Evaluation 04/01/15   PT Start Time 0944   PT Stop Time 1029   PT Time Calculation (min) 45 min   Activity Tolerance Patient tolerated treatment well   Behavior During Therapy [facility] for tasks assessed/performed      History reviewed. No pertinent past medical history.  History reviewed. No pertinent past surgical history.  There were no vitals filed for this visit.  Visit Diagnosis:  Right shoulder pain  Shoulder stiffness, right      Subjective Assessment - 03/02/15 0947    Subjective Sore after friday treatment   Limitations Lifting   Patient Stated Goals Return right shoulder to previous level of function.   Currently in Pain? Yes   Pain Score 7    Pain Location Shoulder   Pain Orientation Right   Pain Descriptors / Indicators Sore   Pain Type Surgical pain   Pain Onset More than a month ago   Pain Frequency Intermittent   Aggravating Factors  Increased movement of shoulder   Pain Relieving Factors rest            [facility] PT Assessment - 03/02/15 0001    PROM   Overall PROM  Deficits   PROM Assessment Site Shoulder   Right/Left Shoulder Right   Right Shoulder Flexion 125 Degrees   Right Shoulder External Rotation 70 Degrees                     [facility] Adult PT Treatment/Exercise - 03/02/15 0001    Shoulder Exercises: Supine   Other Supine Exercises supine cane for flexion and ER x10-20   Shoulder Exercises: Pulleys   Flexion --  40mn   Electrical Stimulation   Electrical Stimulation Location Right shoulder.   Electrical Stimulation  Action IFC   Electrical Stimulation Parameters 1-_0    Electrical Stimulation Goals Pain   Manual Therapy   Manual Therapy Passive ROM   Passive ROM low load range for shoulder flexion and ER, Rhythmic stabilization for ER/IR in scaption                PT Education - 03/02/15 1024    Education Details HEP   Person(s) Educated Patient   Methods Explanation;Demonstration;Handout   Comprehension Verbalized understanding;Returned demonstration             PT Long Term Goals - 02/13/15 1247    PT LONG TERM GOAL #1   Title Ind with a HEP.   Time 6   Period Weeks   Status On-going   PT LONG TERM GOAL #2   Title Active right shoulder flexion to 155 degrees+.   Time 6   Period Weeks   Status On-going   PT LONG TERM GOAL #3   Title Active ER to 80 degrees.   Time 6   Period Weeks   Status On-going   PT LONG TERM GOAL #4   Title Patient reach behind back to L2 with right hand.   Time 6   Period Weeks   Status On-going   PT LONG TERM GOAL #  5   Title Right shoulder strength= 5/5.   Period Weeks   Status On-going   PT LONG TERM GOAL #6   Title Patient able to perform ADL's with right shoulder pain not > 3/10.   Time 6   Period Weeks   Status On-going               Plan - 03/02/15 1019    Clinical Impression Statement Patient progressing with all activities per protocol. PROM improvement yet limited due to pain level. Pateint has little pain when relaxed and increased pain with AAROM. No further goals met due to ROM, strength and pain deficits.   Pt will benefit from skilled therapeutic intervention in order to improve on the following deficits Pain;Decreased activity tolerance;Decreased range of motion;Decreased strength   Rehab Potential Excellent   Clinical Impairments Affecting Rehab Potential surgury 12/25/14 current 9 weeks 02/26/15   PT Frequency 3x / week   PT Duration 6 weeks   PT Treatment/Interventions ADLs/Self Care Home  Management;Cryotherapy;Electrical Stimulation;Moist Heat;Ultrasound;Therapeutic activities;Therapeutic exercise;Neuromuscular re-education;Patient/family education;Manual techniques;Passive range of motion   PT Next Visit Plan Continue gentle PROM and AAROM as per  Dr. Latanya Maudlin as per Pt. . Continue to warn Pt. not to over do it at home with RT UE.    Consulted and Agree with Plan of Care Patient        Problem List There are no active problems to display for this patient.   Phillips Climes, PTA 03/02/2015, 10:29 AM  [facility] [address] [place], [place], [address] Phone: [phone]   Fax:  [phone]

## 2015-03-04 ENCOUNTER — Ambulatory Visit: Payer: Worker's Compensation | Admitting: Physical Therapy

## 2015-03-04 DIAGNOSIS — M25611 Stiffness of right shoulder, not elsewhere classified: Secondary | ICD-10-CM

## 2015-03-04 DIAGNOSIS — M25511 Pain in right shoulder: Secondary | ICD-10-CM | POA: Diagnosis not present

## 2015-03-04 NOTE — Therapy (Signed)
[facility] [address] [place], [place], [address] Phone: [phone]   Fax:  [phone]  Physical Therapy Treatment  Patient Details  Name: Jeffrey Logan MRN: [id number] Date of Birth: 1956/01/30 Referring Provider:  Marcene Corning, MD  Encounter Date: 03/04/2015      PT End of Session - 03/04/15 0951    Visit Number 12   Number of Visits 18   Date for PT Re-Evaluation 04/01/15   PT Start Time 0948   PT Stop Time 1031   PT Time Calculation (min) 43 min   Activity Tolerance Patient tolerated treatment well   Behavior During Therapy [facility] for tasks assessed/performed      No past medical history on file.  No past surgical history on file.  There were no vitals filed for this visit.  Visit Diagnosis:  Right shoulder pain  Shoulder stiffness, right      Subjective Assessment - 03/04/15 0949    Subjective "Feels alright."   Patient Stated Goals Return right shoulder to previous level of function.   Currently in Pain? Yes   Pain Score 6    Pain Location Shoulder   Pain Orientation Right   Pain Descriptors / Indicators Aching   Pain Type Surgical pain   Pain Onset More than a month ago            [facility] PT Assessment - 03/04/15 0001    Assessment   Medical Diagnosis Right shoulder RCR.   Onset Date/Surgical Date 12/25/14   Next MD Visit 03/20/2015                     [facility] Adult PT Treatment/Exercise - 03/04/15 0001    Shoulder Exercises: Seated   External Rotation AAROM;Right;Other (comment)  3x10 reps   Flexion AROM;Both;Other (comment)  3x10 reps   Shoulder Exercises: Standing   Flexion AROM;Right;Other (comment)  x25 reps; wall slide   Other Standing Exercises RUE 4 way isometrics 5 sec hold x10 reps flexion, ER and ext 5 sec hold x5 reps   Shoulder Exercises: Pulleys   Flexion Other (comment)  x4 min   Modalities   Modalities Electrical Stimulation   Electrical Stimulation   Electrical  Stimulation Location Right shoulder.   Electrical Stimulation Action IFC   Electrical Stimulation Parameters 1-10 Hz x15 min   Electrical Stimulation Goals Pain                     PT Long Term Goals - 02/13/15 1247    PT LONG TERM GOAL #1   Title Ind with a HEP.   Time 6   Period Weeks   Status On-going   PT LONG TERM GOAL #2   Title Active right shoulder flexion to 155 degrees+.   Time 6   Period Weeks   Status On-going   PT LONG TERM GOAL #3   Title Active ER to 80 degrees.   Time 6   Period Weeks   Status On-going   PT LONG TERM GOAL #4   Title Patient reach behind back to L2 with right hand.   Time 6   Period Weeks   Status On-going   PT LONG TERM GOAL #5   Title Right shoulder strength= 5/5.   Period Weeks   Status On-going   PT LONG TERM GOAL #6   Title Patient able to perform ADL's with right shoulder pain not > 3/10.   Time 6   Period Weeks  Status On-going               Plan - 03/04/15 1017    Clinical Impression Statement Patient did fairly well with treatment today although he had pain during wall slides, AROM flexion, and isometrics in the RUE. Complaints of pain were in the anterior shoulder and posterior shoulder. Also stated that she had pain in the lateral shoulder and indicated the Deltoids. Demonstrated R shoulder guarding during pulley, wall slides and AROM flexion. Normal stimulation response noted following removal of the stiimulation. PROM of R shoulder was not completing secondary to R shoulder guarding at initiation. Experienced 5/10 pain following treatment. Encouraged patient to not overdo RUE acttivty at home.   Pt will benefit from skilled therapeutic intervention in order to improve on the following deficits Pain;Decreased activity tolerance;Decreased range of motion;Decreased strength   Rehab Potential Excellent   Clinical Impairments Affecting Rehab Potential surgury 12/25/14 current 9 weeks 02/26/15   PT Frequency 3x /  week   PT Duration 6 weeks   PT Treatment/Interventions ADLs/Self Care Home Management;Cryotherapy;Electrical Stimulation;Moist Heat;Ultrasound;Therapeutic activities;Therapeutic exercise;Neuromuscular re-education;Patient/family education;Manual techniques;Passive range of motion   PT Next Visit Plan Continue per protocol in media by Daldorf. Continue to warn patinet not to overdo RUE activity at home.   Consulted and Agree with Plan of Care Patient        Problem List There are no active problems to display for this patient.   Evelene Croon, PTA 03/04/2015, 10:58 AM  [facility] [address] [place], [place], [address] Phone: [phone]   Fax:  [phone]

## 2015-03-06 ENCOUNTER — Encounter: Payer: Self-pay | Admitting: Physical Therapy

## 2015-03-06 ENCOUNTER — Ambulatory Visit: Payer: Worker's Compensation | Admitting: Physical Therapy

## 2015-03-06 DIAGNOSIS — M25511 Pain in right shoulder: Secondary | ICD-10-CM | POA: Diagnosis not present

## 2015-03-06 DIAGNOSIS — M25611 Stiffness of right shoulder, not elsewhere classified: Secondary | ICD-10-CM

## 2015-03-06 NOTE — Therapy (Signed)
[facility] [address] [place], [place], [address] Phone: [phone]   Fax:  [phone]  Physical Therapy Treatment  Patient Details  Name: Jeffrey Logan MRN: [id number] Date of Birth: 1955-08-28 Referring Provider:  Marcene Corning, MD  Encounter Date: 03/06/2015      PT End of Session - 03/06/15 1017    Visit Number 13   Number of Visits 18   PT Start Time 0945   PT Stop Time 1029   PT Time Calculation (min) 44 min   Activity Tolerance Patient tolerated treatment well   Behavior During Therapy [facility] for tasks assessed/performed      History reviewed. No pertinent past medical history.  History reviewed. No pertinent past surgical history.  There were no vitals filed for this visit.  Visit Diagnosis:  Right shoulder pain  Shoulder stiffness, right      Subjective Assessment - 03/06/15 0953    Subjective less pain today   Limitations Lifting   Patient Stated Goals Return right shoulder to previous level of function.   Currently in Pain? Yes   Pain Score 4    Pain Location Shoulder   Pain Orientation Right   Pain Descriptors / Indicators Sore   Pain Type Surgical pain   Pain Onset More than a month ago   Pain Frequency Intermittent   Aggravating Factors  movement of shoulder   Pain Relieving Factors rest            [facility] PT Assessment - 03/06/15 0001    PROM   Overall PROM  Deficits   PROM Assessment Site Shoulder   Right/Left Shoulder Right   Right Shoulder External Rotation 70 Degrees                     [facility] Adult PT Treatment/Exercise - 03/06/15 0001    Shoulder Exercises: Standing   Other Standing Exercises UE ranger for elevation and circles 2x10 each   Other Standing Exercises 4 way isometrics x10 each   Shoulder Exercises: Pulleys   Flexion --    Electrical Stimulation   Electrical Stimulation Location Right shoulder.   Electrical Stimulation Action IFC   Electrical  Stimulation Parameters 1-10Hz    Electrical Stimulation Goals Pain   Manual Therapy   Manual Therapy Passive ROM   Passive ROM low load range for shoulder flexion and ER, Rhythmic stabilization for ER/IR in scaption                     PT Long Term Goals - 02/13/15 1247    PT LONG TERM GOAL #1   Title Ind with a HEP.   Time 6   Period Weeks   Status On-going   PT LONG TERM GOAL #2   Title Active right shoulder flexion to 155 degrees+.   Time 6   Period Weeks   Status On-going   PT LONG TERM GOAL #3   Title Active ER to 80 degrees.   Time 6   Period Weeks   Status On-going   PT LONG TERM GOAL #4   Title Patient reach behind back to L2 with right hand.   Time 6   Period Weeks   Status On-going   PT LONG TERM GOAL #5   Title Right shoulder strength= 5/5.   Period Weeks   Status On-going   PT LONG TERM GOAL #6   Title Patient able to perform ADL's with right shoulder pain not > 3/10.  Time 6   Period Weeks   Status On-going               Plan - 03/06/15 1018    Clinical Impression Statement Patient tolerated treatment well today. Reported less pain today. Same PROM degrees for Shoulder today. Unable to meet any further goals due to pain, ROM, and strength deficits   Pt will benefit from skilled therapeutic intervention in order to improve on the following deficits Pain;Decreased activity tolerance;Decreased range of motion;Decreased strength   Rehab Potential Excellent   Clinical Impairments Affecting Rehab Potential surgury 12/25/14 current 10 weeks 03/05/15   PT Frequency 3x / week   PT Duration 6 weeks   PT Treatment/Interventions ADLs/Self Care Home Management;Cryotherapy;Electrical Stimulation;Moist Heat;Ultrasound;Therapeutic activities;Therapeutic exercise;Neuromuscular re-education;Patient/family education;Manual techniques;Passive range of motion   PT Next Visit Plan Continue per protocol in media by Daldorf. Continue to warn patinet not to  overdo RUE activity at home.   Consulted and Agree with Plan of Care Patient        Problem List There are no active problems to display for this patient.   [name], PTA 03/06/2015, 10:32 AM  [facility] [address] [place], [place], [address] Phone: [phone]   Fax:  [phone]

## 2015-03-09 ENCOUNTER — Ambulatory Visit: Payer: Worker's Compensation | Attending: Orthopaedic Surgery | Admitting: Physical Therapy

## 2015-03-09 ENCOUNTER — Encounter: Payer: Self-pay | Admitting: Physical Therapy

## 2015-03-09 DIAGNOSIS — M25611 Stiffness of right shoulder, not elsewhere classified: Secondary | ICD-10-CM

## 2015-03-09 DIAGNOSIS — M25511 Pain in right shoulder: Secondary | ICD-10-CM | POA: Insufficient documentation

## 2015-03-09 NOTE — Therapy (Signed)
[facility] [address] [place], [place], [address] Phone: [phone]   Fax:  [phone]  Physical Therapy Treatment  Patient Details  Name: Jeffrey Logan MRN: [id number] Date of Birth: November 11, 1955 Referring Provider:  Marcene Corning, MD  Encounter Date: 03/09/2015      PT End of Session - 03/09/15 1118    Visit Number 14   Number of Visits 18   Date for PT Re-Evaluation 04/01/15   PT Start Time 1116   PT Stop Time 1152   PT Time Calculation (min) 36 min   Activity Tolerance Patient tolerated treatment well   Behavior During Therapy [facility] for tasks assessed/performed      No past medical history on file.  No past surgical history on file.  There were no vitals filed for this visit.  Visit Diagnosis:  Right shoulder pain  Shoulder stiffness, right      Subjective Assessment - 03/09/15 1117    Subjective Reports that shoulder is a little achey today but attributes ache to damp weather.   Limitations Lifting   Patient Stated Goals Return right shoulder to previous level of function.   Currently in Pain? Yes   Pain Score 6    Pain Location Shoulder   Pain Orientation Right   Pain Descriptors / Indicators Aching;Throbbing;Tender   Pain Type Surgical pain   Pain Onset More than a month ago            [facility] PT Assessment - 03/09/15 0001    Assessment   Medical Diagnosis Right shoulder RCR.   Onset Date/Surgical Date 12/25/14   Next MD Visit 03/20/2015                     [facility] Adult PT Treatment/Exercise - 03/09/15 0001    Shoulder Exercises: Standing   Other Standing Exercises UE ranger for elevation x20 reps, circles x10 reps; RUE wall ladder x3 min  Reports pain/catching in anterior R shoulder during circles   Other Standing Exercises RUE ball on wall x3 min   Modalities   Modalities Electrical Stimulation   Electrical Stimulation   Electrical Stimulation Location Right shoulder.   Electrical  Stimulation Action IFC   Electrical Stimulation Parameters 1-10 Hz x15 min   Electrical Stimulation Goals Pain   Manual Therapy   Manual Therapy Passive ROM   Passive ROM R shoulder into flexion with oscillations for relaxation with gentle holds at end range                     PT Long Term Goals - 02/13/15 1247    PT LONG TERM GOAL #1   Title Ind with a HEP.   Time 6   Period Weeks   Status On-going   PT LONG TERM GOAL #2   Title Active right shoulder flexion to 155 degrees+.   Time 6   Period Weeks   Status On-going   PT LONG TERM GOAL #3   Title Active ER to 80 degrees.   Time 6   Period Weeks   Status On-going   PT LONG TERM GOAL #4   Title Patient reach behind back to L2 with right hand.   Time 6   Period Weeks   Status On-going   PT LONG TERM GOAL #5   Title Right shoulder strength= 5/5.   Period Weeks   Status On-going   PT LONG TERM GOAL #6   Title Patient able to perform ADL's  with right shoulder pain not > 3/10.   Time 6   Period Weeks   Status On-going               Plan - 03/09/15 1142    Clinical Impression Statement Patient was limited by reported pain during today's treatment. During standing shoulder exercises patient appeared to be able to achieve 90 degrees of shoulder flexion and then reported increased pain. R shoulder was very guarded today. Unable to tolerate low level PROM of the R shoulder and began grimacing around 78 deg of flexion. Reported pain, throbbing and tenderness in the entire R shoulder although patient did not express tenderness was felt during PTA's palpation. R shoulder appeared red as maybe a light suburn but patient denied any yard work. Patient unable to tolerate more than 3 output on electrical stimulation machine. Was strongly encouraged to continue AAROM HEP at home in fear of losing ROM. Normal modalities response noted following removal of the modalities. Experienced 7/10 R shoulder pain following low level  treatment.   Pt will benefit from skilled therapeutic intervention in order to improve on the following deficits Pain;Decreased activity tolerance;Decreased range of motion;Decreased strength   Rehab Potential Excellent   Clinical Impairments Affecting Rehab Potential surgury 12/25/14 current 10 weeks 03/05/15   PT Frequency 3x / week   PT Duration 6 weeks   PT Treatment/Interventions ADLs/Self Care Home Management;Cryotherapy;Electrical Stimulation;Moist Heat;Ultrasound;Therapeutic activities;Therapeutic exercise;Neuromuscular re-education;Patient/family education;Manual techniques;Passive range of motion   PT Next Visit Plan Continue per protocol in media by Daldorf. Continue to warn patinet not to overdo RUE activity at home.   Consulted and Agree with Plan of Care Patient        Problem List There are no active problems to display for this patient.   Evelene Croon, PTA 03/09/2015, 11:59 AM  [facility] [address] [place], [place], [address] Phone: [phone]   Fax:  [phone]

## 2015-03-11 ENCOUNTER — Ambulatory Visit: Payer: Worker's Compensation | Admitting: Physical Therapy

## 2015-03-11 ENCOUNTER — Encounter: Payer: Self-pay | Admitting: Physical Therapy

## 2015-03-11 DIAGNOSIS — M25511 Pain in right shoulder: Secondary | ICD-10-CM | POA: Diagnosis not present

## 2015-03-11 DIAGNOSIS — M25611 Stiffness of right shoulder, not elsewhere classified: Secondary | ICD-10-CM

## 2015-03-11 NOTE — Therapy (Signed)
[facility] [address] [place], [place], [address] Phone: [phone]   Fax:  [phone]  Physical Therapy Treatment  Patient Details  Name: Jeffrey Logan MRN: [id number] Date of Birth: 08/22/1955 Referring Provider:  Marcene Corning, MD  Encounter Date: 03/11/2015      PT End of Session - 03/11/15 1303    Visit Number 15   Number of Visits 18   Date for PT Re-Evaluation 04/01/15   PT Start Time 1301   PT Stop Time 1351   PT Time Calculation (min) 50 min   Activity Tolerance Patient tolerated treatment well   Behavior During Therapy [facility] for tasks assessed/performed      No past medical history on file.  No past surgical history on file.  There were no vitals filed for this visit.  Visit Diagnosis:  Right shoulder pain  Shoulder stiffness, right      Subjective Assessment - 03/11/15 1302    Subjective Reports that pain is better today but still hurts. Reports pain in the anterior R shoulder. Reports that he does small activity at home with RUE at home to keep shoulder moving. States that he completed half range AAROM exercises Tuesday due to continued pain but did not complete Monday secondary to increased pain.   Limitations Lifting   Patient Stated Goals Return right shoulder to previous level of function.   Currently in Pain? Yes   Pain Score 5    Pain Location Shoulder   Pain Orientation Right;Anterior   Pain Descriptors / Indicators Sore;Tender   Pain Type Surgical pain   Pain Onset More than a month ago            [facility] PT Assessment - 03/11/15 0001    Assessment   Medical Diagnosis Right shoulder RCR.   Onset Date/Surgical Date 12/25/14   Next MD Visit 03/20/2015   PROM   Overall PROM  Deficits   PROM Assessment Site Shoulder   Right/Left Shoulder Right   Right Shoulder Flexion 105 Degrees                     [facility] Adult PT Treatment/Exercise - 03/11/15 0001    Shoulder Exercises:  Standing   External Rotation Right;10 reps;Other (comment)  Isometrics   Internal Rotation Right;10 reps;Other (comment)  Isometrics   Extension Right;10 reps;Other (comment)  Isometrics   Other Standing Exercises RUE wall ladder x3 min   Other Standing Exercises RUE ball on wall x1 min  Stopped due to increased pain at approx. 90 deg of flexion   Shoulder Exercises: Pulleys   Flexion 3 minutes   Other Pulley Exercises UE Ranger flex/circles x20 reps   Shoulder Exercises: ROM/Strengthening   UBE (Upper Arm Bike) 120 RPM x 5 min  Completed PROM/AAROM with very slow speed   Modalities   Modalities Electrical Stimulation   Electrical Stimulation   Electrical Stimulation Location Right shoulder.   Electrical Stimulation Action IFC   Electrical Stimulation Parameters 1-10 Hz x15 min   Electrical Stimulation Goals Pain   Manual Therapy   Manual Therapy Passive ROM   Passive ROM R shoulder into flexion with oscillations for relaxation with gentle holds at end range                     PT Long Term Goals - 02/13/15 1247    PT LONG TERM GOAL #1   Title Ind with a HEP.   Time 6  Period Weeks   Status On-going   PT LONG TERM GOAL #2   Title Active right shoulder flexion to 155 degrees+.   Time 6   Period Weeks   Status On-going   PT LONG TERM GOAL #3   Title Active ER to 80 degrees.   Time 6   Period Weeks   Status On-going   PT LONG TERM GOAL #4   Title Patient reach behind back to L2 with right hand.   Time 6   Period Weeks   Status On-going   PT LONG TERM GOAL #5   Title Right shoulder strength= 5/5.   Period Weeks   Status On-going   PT LONG TERM GOAL #6   Title Patient able to perform ADL's with right shoulder pain not > 3/10.   Time 6   Period Weeks   Status On-going               Plan - 03/11/15 1336    Clinical Impression Statement Patient able to tolerate slightly more exercise today. Continues to report experiencing soreness and  tenderness in the anterior and lateral R shoulder. Isometric for shoulder flexion with elbow flexed discontinued today secondary to increased pain reported by patient and reported some pain with IR isometric exercise. PROM of the R shoulder completed with facial grimacing started less than 90 deg. PROM R shoulder measurement was 105 deg. Empty end feels noted in PROM into flexion. Patient was directed for 4 output of electrical stimulation today but stated following removal that he did not like stimulation today. PTA was present in therapy gym while patient was on stimulation but was not asked to turn stimulation off. Was strongly encouraged again today to complete AAROM exercises for fear of losing R shoulder ROM. Normal modalities response noted following removal of the modalities. Experienced 6/10 pain following treatment and stimulation per patient.    Pt will benefit from skilled therapeutic intervention in order to improve on the following deficits Pain;Decreased activity tolerance;Decreased range of motion;Decreased strength   Rehab Potential Excellent   Clinical Impairments Affecting Rehab Potential surgury 12/25/14 current 10 weeks 03/05/15   PT Frequency 3x / week   PT Duration 6 weeks   PT Treatment/Interventions ADLs/Self Care Home Management;Cryotherapy;Electrical Stimulation;Moist Heat;Ultrasound;Therapeutic activities;Therapeutic exercise;Neuromuscular re-education;Patient/family education;Manual techniques;Passive range of motion   PT Next Visit Plan Continue per protocol in media by Daldorf. Continue to warn patinet not to overdo RUE activity at home. Attempt combo to R shoulder next treatment instead of stimulation.   Consulted and Agree with Plan of Care Patient        Problem List There are no active problems to display for this patient.   Evelene Croon, PTA 03/11/2015, 1:58 PM  [facility] [address] [place], [place],  [address] Phone: [phone]   Fax:  [phone]

## 2015-03-13 ENCOUNTER — Ambulatory Visit: Payer: Worker's Compensation

## 2015-03-13 DIAGNOSIS — M25511 Pain in right shoulder: Secondary | ICD-10-CM | POA: Diagnosis not present

## 2015-03-13 DIAGNOSIS — M25611 Stiffness of right shoulder, not elsewhere classified: Secondary | ICD-10-CM

## 2015-03-13 NOTE — Therapy (Signed)
[facility] [address] [place], [place], [address] Phone: [phone]   Fax:  [phone]  Physical Therapy Treatment  Patient Details  Name: Jeffrey Logan MRN: [id number] Date of Birth: 07-06-1956 Referring Provider:  Marcene Corning, MD  Encounter Date: 03/13/2015      PT End of Session - 03/13/15 1027    Visit Number 16   Number of Visits 18   Date for PT Re-Evaluation 04/01/15   PT Start Time 0946   Activity Tolerance Patient limited by pain;Other (comment)  Pt had multiple occurrences where he felt sharp pains in rigth shoulder without any exercise or movment.   Behavior During Therapy [facility] for tasks assessed/performed      No past medical history on file.  No past surgical history on file.  There were no vitals filed for this visit.  Visit Diagnosis:  Right shoulder pain  Shoulder stiffness, right      Subjective Assessment - 03/13/15 0952    Subjective pt. continues to experience pain in right shoulder; states that home exercises are going well.   Limitations Lifting   Patient Stated Goals Return right shoulder to previous level of function.   Currently in Pain? Yes   Pain Score 5    Pain Location Shoulder   Pain Orientation Right   Pain Type Surgical pain   Pain Onset More than a month ago   Pain Frequency Intermittent   Multiple Pain Sites No                         [facility] Adult PT Treatment/Exercise - 03/13/15 0001    Shoulder Exercises: Prone   Retraction Weight (lbs) prone rows without weight x 20   Extension AROM;20 reps  no weight   Shoulder Exercises: Sidelying   External Rotation AROM;20 reps  no weight or resistance   Shoulder Exercises: ROM/Strengthening   UBE (Upper Arm Bike) 120 RPM x 3 min  had difficulty; had to stop after 3 min   Modalities   Modalities Electrical Stimulation   Electrical Stimulation   Electrical Stimulation Location Right shoulder.   Electrical  Stimulation Action IFC   Electrical Stimulation Parameters 1-10 Hz   Electrical Stimulation Goals Pain                PT Education - 03/13/15 1026    Education provided Yes   Education Details continue HEP; attempt to perform ice massage over right biceps tendon   Person(s) Educated Patient   Methods Explanation   Comprehension Verbalized understanding             PT Long Term Goals - 02/13/15 1247    PT LONG TERM GOAL #1   Title Ind with a HEP.   Time 6   Period Weeks   Status On-going   PT LONG TERM GOAL #2   Title Active right shoulder flexion to 155 degrees+.   Time 6   Period Weeks   Status On-going   PT LONG TERM GOAL #3   Title Active ER to 80 degrees.   Time 6   Period Weeks   Status On-going   PT LONG TERM GOAL #4   Title Patient reach behind back to L2 with right hand.   Time 6   Period Weeks   Status On-going   PT LONG TERM GOAL #5   Title Right shoulder strength= 5/5.   Period Weeks   Status On-going   PT  LONG TERM GOAL #6   Title Patient able to perform ADL's with right shoulder pain not > 3/10.   Time 6   Period Weeks   Status On-going               Plan - 03/13/15 1217    Rehab Potential Excellent   Clinical Impairments Affecting Rehab Potential surgury 12/25/14 current 10 weeks 03/05/15   PT Frequency 3x / week   PT Duration 6 weeks   PT Treatment/Interventions ADLs/Self Care Home Management;Cryotherapy;Electrical Stimulation;Moist Heat;Ultrasound;Therapeutic activities;Therapeutic exercise;Neuromuscular re-education;Patient/family education;Manual techniques;Passive range of motion   PT Next Visit Plan Continue to progress exercises per protocol.   Consulted and Agree with Plan of Care Patient        Problem List There are no active problems to display for this patient.   Concha Pyo 03/13/2015, 12:20 PM  [facility] [address] [place], [place], [address] Phone:  [phone]   Fax:  [phone]

## 2015-03-16 ENCOUNTER — Ambulatory Visit: Payer: Worker's Compensation | Admitting: Physical Therapy

## 2015-03-16 ENCOUNTER — Encounter: Payer: Self-pay | Admitting: Physical Therapy

## 2015-03-16 DIAGNOSIS — M25511 Pain in right shoulder: Secondary | ICD-10-CM

## 2015-03-16 DIAGNOSIS — M25611 Stiffness of right shoulder, not elsewhere classified: Secondary | ICD-10-CM

## 2015-03-16 NOTE — Therapy (Signed)
[facility] [address] [place], [place], [address] Phone: [phone]   Fax:  [phone]  Physical Therapy Treatment  Patient Details  Name: Jeffrey Logan MRN: [id number] Date of Birth: Jun 13, 1956 Referring Provider:  Marcene Corning, MD  Encounter Date: 03/16/2015      PT End of Session - 03/16/15 1341    Visit Number 17   Number of Visits 18   Date for PT Re-Evaluation 04/01/15   PT Start Time 1312   PT Stop Time 1354   PT Time Calculation (min) 42 min   Activity Tolerance Patient limited by pain;Other (comment)   Behavior During Therapy [facility] for tasks assessed/performed      History reviewed. No pertinent past medical history.  History reviewed. No pertinent past surgical history.  There were no vitals filed for this visit.  Visit Diagnosis:  Right shoulder pain  Shoulder stiffness, right      Subjective Assessment - 03/16/15 1315    Subjective patient has some pain in shoulder and no compliants after last treatment   Limitations Lifting   Patient Stated Goals Return right shoulder to previous level of function.   Currently in Pain? Yes   Pain Score 4    Pain Location Shoulder   Pain Orientation Right   Pain Descriptors / Indicators Sore   Pain Type Surgical pain   Pain Onset More than a month ago   Aggravating Factors  movement of shoulder   Pain Relieving Factors rest            [facility] PT Assessment - 03/16/15 0001    PROM   Overall PROM  Deficits   PROM Assessment Site Shoulder   Right/Left Shoulder Right   Right Shoulder Flexion 134 Degrees   Right Shoulder External Rotation 60 Degrees                     [facility] Adult PT Treatment/Exercise - 03/16/15 0001    Shoulder Exercises: Standing   Flexion AROM;Right;10 reps  in scaption 2 sets   Other Standing Exercises IR/ER with yellow t-band half range x10 each   Shoulder Exercises: Pulleys   Flexion --    Other Pulley Exercises UE  Ranger flex/circles x20 reps   Shoulder Exercises: ROM/Strengthening   UBE (Upper Arm Bike) 120 RPM x 6 min   Modalities   Modalities Ultrasound   Ultrasound   Ultrasound Location right ant shoulder   Ultrasound Parameters 1.5w/cm2/50%/66mhz x70min   Ultrasound Goals Pain   Manual Therapy   Manual Therapy Passive ROM   Passive ROM gentle range for ER and flexion, pt guarding/rhythmic stabiliaztion IR/ER inscaption, flex/ext @90                      PT Long Term Goals - 02/13/15 1247    PT LONG TERM GOAL #1   Title Ind with a HEP.   Time 6   Period Weeks   Status On-going   PT LONG TERM GOAL #2   Title Active right shoulder flexion to 155 degrees+.   Time 6   Period Weeks   Status On-going   PT LONG TERM GOAL #3   Title Active ER to 80 degrees.   Time 6   Period Weeks   Status On-going   PT LONG TERM GOAL #4   Title Patient reach behind back to L2 with right hand.   Time 6   Period Weeks   Status On-going  PT LONG TERM GOAL #5   Title Right shoulder strength= 5/5.   Period Weeks   Status On-going   PT LONG TERM GOAL #6   Title Patient able to perform ADL's with right shoulder pain not > 3/10.   Time 6   Period Weeks   Status On-going               Plan - 03/16/15 1355    Clinical Impression Statement Patient progressing with all activities and has improved PROM for right shoulder flexion, slight decrease for ER today. Patient had no complaints with gentle strength half range exercises. Goals ongoing due to ROM, pain and strength deficits. Started [place] per MPT today.   Pt will benefit from skilled therapeutic intervention in order to improve on the following deficits Pain;Decreased activity tolerance;Decreased range of motion;Decreased strength   Rehab Potential Excellent   Clinical Impairments Affecting Rehab Potential surgury 12/25/14 current 11 weeks 03/12/15   PT Frequency 3x / week   PT Duration 6 weeks   PT Treatment/Interventions ADLs/Self  Care Home Management;Cryotherapy;Electrical Stimulation;Moist Heat;Ultrasound;Therapeutic activities;Therapeutic exercise;Neuromuscular re-education;Patient/family education;Manual techniques;Passive range of motion   PT Next Visit Plan Continue to progress exercises per protocol/MPT , MD note next visit for Daldorf   Consulted and Agree with Plan of Care Patient        Problem List There are no active problems to display for this patient.   [name], PTA 03/16/2015, 1:58 PM  [facility] [address] [place], [place], [address] Phone: [phone]   Fax:  [phone]

## 2015-03-18 ENCOUNTER — Ambulatory Visit: Payer: Worker's Compensation | Admitting: Physical Therapy

## 2015-03-18 ENCOUNTER — Encounter: Payer: Self-pay | Admitting: Physical Therapy

## 2015-03-18 DIAGNOSIS — M25611 Stiffness of right shoulder, not elsewhere classified: Secondary | ICD-10-CM

## 2015-03-18 DIAGNOSIS — M25511 Pain in right shoulder: Secondary | ICD-10-CM | POA: Diagnosis not present

## 2015-03-18 NOTE — Therapy (Signed)
[facility] [address] [place], [place], [address] Phone: [phone]   Fax:  [phone]  Physical Therapy Treatment  Patient Details  Name: Jeffrey Logan MRN: [id number] Date of Birth: [birth year] Referring Provider:  Marcene Corning, MD  Encounter Date: 03/18/2015      PT End of Session - 03/18/15 0927    Visit Number 18   Number of Visits 18   Date for PT Re-Evaluation 04/01/15   PT Start Time 0900   PT Stop Time 0940   PT Time Calculation (min) 40 min   Activity Tolerance Patient limited by pain;Other (comment)   Behavior During Therapy [facility] for tasks assessed/performed      History reviewed. No pertinent past medical history.  History reviewed. No pertinent past surgical history.  There were no vitals filed for this visit.  Visit Diagnosis:  Right shoulder pain  Shoulder stiffness, right      Subjective Assessment - 03/18/15 0912    Subjective patient has some pain in shoulder and no compliants after last treatment   Limitations Lifting   Patient Stated Goals Return right shoulder to previous level of function.   Currently in Pain? Yes   Pain Score 4    Pain Location Shoulder   Pain Orientation Right   Pain Descriptors / Indicators Sore   Pain Type Surgical pain   Pain Onset More than a month ago   Pain Frequency Intermittent   Aggravating Factors  movement of shoulder   Pain Relieving Factors rest            [facility] PT Assessment - 03/18/15 0001    PROM   Overall PROM  Deficits   PROM Assessment Site Shoulder   Right/Left Shoulder Right   Right Shoulder Flexion 135 Degrees   Right Shoulder External Rotation 70 Degrees                     [facility] Adult PT Treatment/Exercise - 03/18/15 0001    Shoulder Exercises: Pulleys   Flexion --    Other Pulley Exercises UE Ranger flex/circles x20 reps   Shoulder Exercises: ROM/Strengthening   UBE (Upper Arm Bike) 120 RPM x 6 min   Ultrasound   Ultrasound Location right ant shoulder   Ultrasound Parameters 1.5w/cm2/50%/19mhz x74min   Ultrasound Goals Pain   Manual Therapy   Manual Therapy Passive ROM   Passive ROM gentle range for ER and flexion, pt guarding/rhythmic stabiliaztion IR/ER inscaption, flex/ext @90                      PT Long Term Goals - 02/13/15 1247    PT LONG TERM GOAL #1   Title Ind with a HEP.   Time 6   Period Weeks   Status On-going   PT LONG TERM GOAL #2   Title Active right shoulder flexion to 155 degrees+.   Time 6   Period Weeks   Status On-going   PT LONG TERM GOAL #3   Title Active ER to 80 degrees.   Time 6   Period Weeks   Status On-going   PT LONG TERM GOAL #4   Title Patient reach behind back to L2 with right hand.   Time 6   Period Weeks   Status On-going   PT LONG TERM GOAL #5   Title Right shoulder strength= 5/5.   Period Weeks   Status On-going   PT LONG TERM GOAL #6   Title  Patient able to perform ADL's with right shoulder pain not > 3/10.   Time 6   Period Weeks   Status On-going               Plan - 03/18/15 0940    Clinical Impression Statement Patient progressing with P/AAROM today and has began genlte strengthing activities. Patient has been using arm some at home (vacuuming pool) yet reported arm at side and has not lifted anything. Goals ongoing due to protocol limitations.   Pt will benefit from skilled therapeutic intervention in order to improve on the following deficits Pain;Decreased activity tolerance;Decreased range of motion;Decreased strength   Rehab Potential Excellent   Clinical Impairments Affecting Rehab Potential surgury 12/25/14 current 12 weeks 03/19/15   PT Frequency 3x / week   PT Duration 6 weeks   PT Treatment/Interventions ADLs/Self Care Home Management;Cryotherapy;Electrical Stimulation;Moist Heat;Ultrasound;Therapeutic activities;Therapeutic exercise;Neuromuscular re-education;Patient/family education;Manual techniques;Passive  range of motion   PT Next Visit Plan Continue to progress per MD. [name]   Consulted and Agree with Plan of Care Patient        Problem List There are no active problems to display for this patient.  [name], PTA 03/18/2015 9:44 AM   [name], [name], PTA 03/18/2015, 9:44 AM  [name] [address] [place], [place], [address] Phone: [phone]   Fax:  [phone]

## 2015-03-23 ENCOUNTER — Ambulatory Visit: Payer: Worker's Compensation | Admitting: Physical Therapy

## 2015-03-23 DIAGNOSIS — M25511 Pain in right shoulder: Secondary | ICD-10-CM

## 2015-03-23 DIAGNOSIS — M25611 Stiffness of right shoulder, not elsewhere classified: Secondary | ICD-10-CM

## 2015-03-23 NOTE — Therapy (Signed)
[facility] [address] [place], [place], [address] Phone: [phone]   Fax:  [phone]  Physical Therapy Treatment  Patient Details  Name: Jeffrey Logan MRN: [id number] Date of Birth: 11/04/1955 Referring Provider:  Marcene Corning, MD  Encounter Date: 03/23/2015      PT End of Session - 03/23/15 0903    Visit Number 19   Number of Visits 36   Date for PT Re-Evaluation 05/04/15  per new order for 2-3x/ week for 4-6 weeks   PT Start Time 0900   PT Stop Time 0942   PT Time Calculation (min) 42 min   Activity Tolerance Patient tolerated treatment well   Behavior During Therapy [facility] for tasks assessed/performed      No past medical history on file.  No past surgical history on file.  There were no vitals filed for this visit.  Visit Diagnosis:  Right shoulder pain  Shoulder stiffness, right      Subjective Assessment - 03/23/15 0901    Subjective Patient reports shoulder is feeling okay this morning. States that PA was pleased with progress and to continue PT. Reports throbbing sensation yesterday.   Limitations Lifting   Patient Stated Goals Return right shoulder to previous level of function.   Currently in Pain? Yes   Pain Score 4    Pain Location Shoulder   Pain Orientation Right   Pain Descriptors / Indicators Aching   Pain Type Surgical pain   Pain Onset More than a month ago            [facility] PT Assessment - 03/23/15 0001    Assessment   Medical Diagnosis Right shoulder RCR.   Onset Date/Surgical Date 12/25/14                     [facility] Adult PT Treatment/Exercise - 03/23/15 0001    Shoulder Exercises: Seated   Flexion AROM;Right;20 reps  with ER/ thumbs up   Other Seated Exercises R shoulder AROM scaption x20 reps   Shoulder Exercises: Prone   Retraction AROM;Right;20 reps   Extension AROM;Right;20 reps   Shoulder Exercises: Standing   External Rotation Strengthening;Right;20  reps;Theraband   Theraband Level (Shoulder External Rotation) Level 1 (Yellow)   Internal Rotation Strengthening;Right;20 reps;Theraband   Theraband Level (Shoulder Internal Rotation) Level 1 (Yellow)   Shoulder Exercises: Pulleys   Flexion Other (comment)  x4 min   Other Pulley Exercises UE Ranger flex/circles x20 reps   Shoulder Exercises: ROM/Strengthening   UBE (Upper Arm Bike) 120 RPM x 6 min   Rhythmic Stabilization, Supine Flex/ext 4 x30 sec, ER/IR 4 x30 sec   Modalities   Modalities Ultrasound   Ultrasound   Ultrasound Location R anterior shoulder   Ultrasound Parameters 1.5 w/cm2, 100%, 1 mhz   Ultrasound Goals Pain                     PT Long Term Goals - 02/13/15 1247    PT LONG TERM GOAL #1   Title Ind with a HEP.   Time 6   Period Weeks   Status On-going   PT LONG TERM GOAL #2   Title Active right shoulder flexion to 155 degrees+.   Time 6   Period Weeks   Status On-going   PT LONG TERM GOAL #3   Title Active ER to 80 degrees.   Time 6   Period Weeks   Status On-going   PT LONG TERM GOAL #  4   Title Patient reach behind back to L2 with right hand.   Time 6   Period Weeks   Status On-going   PT LONG TERM GOAL #5   Title Right shoulder strength= 5/5.   Period Weeks   Status On-going   PT LONG TERM GOAL #6   Title Patient able to perform ADL's with right shoulder pain not > 3/10.   Time 6   Period Weeks   Status On-going               Plan - 03/23/15 0944    Clinical Impression Statement Patient tolerated treatment well today although he experienced slight pull during IR with therband and indicated some pain in anterior shoulder during ER with theraband. Displayed facial grimacing during R shoulder scaption and flexion but did not verbalize pain to PTA. Completed all exercises with safe form and technique and at slow speed. Normal [place] response noted following removal of the [place]. All goals remain on-going secondary to decreased ROM,  strength, and increased pain. Experienced 4/10 pain following treatment.   Pt will benefit from skilled therapeutic intervention in order to improve on the following deficits Pain;Decreased activity tolerance;Decreased range of motion;Decreased strength   Rehab Potential Excellent   Clinical Impairments Affecting Rehab Potential surgury 12/25/14 current 12 weeks 03/19/15   PT Frequency 3x / week   PT Duration 6 weeks   PT Treatment/Interventions ADLs/Self Care Home Management;Cryotherapy;Electrical Stimulation;Moist Heat;Ultrasound;Therapeutic activities;Therapeutic exercise;Neuromuscular re-education;Patient/family education;Manual techniques;Passive range of motion   PT Next Visit Plan Continue to progress per MD. [name]   Consulted and Agree with Plan of Care Patient        Problem List There are no active problems to display for this patient.   Florence Canner, PTA 03/23/2015 9:53 AM  [facility] [address] [place], [place], [address] Phone: [phone]   Fax:  [phone]

## 2015-03-25 ENCOUNTER — Ambulatory Visit: Payer: Worker's Compensation | Admitting: Physical Therapy

## 2015-03-25 DIAGNOSIS — M25511 Pain in right shoulder: Secondary | ICD-10-CM

## 2015-03-25 DIAGNOSIS — M25611 Stiffness of right shoulder, not elsewhere classified: Secondary | ICD-10-CM

## 2015-03-25 NOTE — Therapy (Signed)
[facility] [address] [place], [place], [address] Phone: [phone]   Fax:  [phone]  Physical Therapy Treatment  Patient Details  Name: Jeffrey Logan MRN: [id number] Date of Birth: 1955-12-22 Referring Provider:  Marcene Corning, MD  Encounter Date: 03/25/2015      PT End of Session - 03/25/15 0909    Visit Number 20   Number of Visits 36   Date for PT Re-Evaluation 05/04/15   PT Start Time 0905   PT Stop Time 0959   PT Time Calculation (min) 54 min   Activity Tolerance Patient tolerated treatment well   Behavior During Therapy [facility] for tasks assessed/performed      No past medical history on file.  No past surgical history on file.  There were no vitals filed for this visit.  Visit Diagnosis:  Right shoulder pain  Shoulder stiffness, right      Subjective Assessment - 03/25/15 0908    Subjective Reports shoulder feeling sore today. Woke him up this morning throbbing for unknown reason per patient.   Limitations Lifting   Patient Stated Goals Return right shoulder to previous level of function.   Currently in Pain? Yes   Pain Score 6    Pain Location Shoulder   Pain Orientation Right   Pain Descriptors / Indicators Sore   Pain Type Surgical pain   Pain Onset More than a month ago            [facility] PT Assessment - 03/25/15 0001    Assessment   Medical Diagnosis Right shoulder RCR.   Onset Date/Surgical Date 12/25/14   Next MD Visit 04/20/2015                     [facility] Adult PT Treatment/Exercise - 03/25/15 0001    Shoulder Exercises: Seated   Flexion AROM;Right;20 reps  Superior R shoulder popping   Other Seated Exercises R shoulder AROM scaption x20 reps   Shoulder Exercises: Prone   Extension AROM;Right  x25 reps   Shoulder Exercises: Sidelying   External Rotation AROM;Right;20 reps   Shoulder Exercises: Standing   External Rotation Strengthening;Right;Theraband;Other (comment)  x25  reps   Theraband Level (Shoulder External Rotation) Level 1 (Yellow)   Internal Rotation Strengthening;Right;Theraband;Other (comment)  x25 reps   Theraband Level (Shoulder Internal Rotation) Level 1 (Yellow)   Row Strengthening;Right;20 reps;Theraband   Theraband Level (Shoulder Row) Level 1 (Yellow)   Shoulder Exercises: Pulleys   Flexion 3 minutes   Other Pulley Exercises UE Ranger flex/circles x20 reps   Shoulder Exercises: ROM/Strengthening   UBE (Upper Arm Bike) 120 RPM x 6 min  Reported popping in sup R shoulder "they took the bone out"   Rhythmic Stabilization, Supine Flex/ext 6 x30 sec, ER/IR 6 x30 sec   Modalities   Modalities Ultrasound   Ultrasound   Ultrasound Location R anterior shoulder   Ultrasound Parameters 1.5 w/cm2, 100%, 1 mhz   Ultrasound Goals Pain                     PT Long Term Goals - 02/13/15 1247    PT LONG TERM GOAL #1   Title Ind with a HEP.   Time 6   Period Weeks   Status On-going   PT LONG TERM GOAL #2   Title Active right shoulder flexion to 155 degrees+.   Time 6   Period Weeks   Status On-going   PT LONG TERM GOAL #3  Title Active ER to 80 degrees.   Time 6   Period Weeks   Status On-going   PT LONG TERM GOAL #4   Title Patient reach behind back to L2 with right hand.   Time 6   Period Weeks   Status On-going   PT LONG TERM GOAL #5   Title Right shoulder strength= 5/5.   Period Weeks   Status On-going   PT LONG TERM GOAL #6   Title Patient able to perform ADL's with right shoulder pain not > 3/10.   Time 6   Period Weeks   Status On-going               Plan - 03/25/15 0959    Clinical Impression Statement Patient did fairly well without treatment today and increasing repititions of exercises. Experienced popping in R shoulder during UBE, AROM flexion. Continues to tolerate rhythmic stabs of the RUE in flexion/extension and ER/IR. Normal [place] response noted following removal of the modality. Goals remain  on-going secondary to decreased ROM, strength, and increased pain. Experienced 5/10 pain following treatment.   Pt will benefit from skilled therapeutic intervention in order to improve on the following deficits Pain;Decreased activity tolerance;Decreased range of motion;Decreased strength   Rehab Potential Excellent   Clinical Impairments Affecting Rehab Potential surgury 12/25/14 current 12 weeks 03/19/15   PT Frequency 3x / week   PT Duration 6 weeks   PT Treatment/Interventions ADLs/Self Care Home Management;Cryotherapy;Electrical Stimulation;Moist Heat;Ultrasound;Therapeutic activities;Therapeutic exercise;Neuromuscular re-education;Patient/family education;Manual techniques;Passive range of motion;Iontophoresis /ml Dexamethasone   PT Next Visit Plan Continue to progress per MD. [name].  Please begin Ionto patch.   Consulted and Agree with Plan of Care Patient        Problem List There are no active problems to display for this patient.   Evelene Croon, PTA 03/25/2015, 10:10 AM  [facility] [address] [place], [place], [address] Phone: [phone]   Fax:  [phone]

## 2015-03-27 ENCOUNTER — Ambulatory Visit: Payer: Worker's Compensation | Admitting: Physical Therapy

## 2015-03-27 DIAGNOSIS — M25511 Pain in right shoulder: Secondary | ICD-10-CM

## 2015-03-27 DIAGNOSIS — M25611 Stiffness of right shoulder, not elsewhere classified: Secondary | ICD-10-CM

## 2015-03-27 NOTE — Therapy (Signed)
[facility] [address] [place], [place], [address] Phone: [phone]   Fax:  [phone]  Physical Therapy Treatment  Patient Details  Name: Jeffrey Logan MRN: [id number] Date of Birth: 20-Jul-1956 Referring Provider:  Marcene Corning, MD  Encounter Date: 03/27/2015      PT End of Session - 03/27/15 0906    Visit Number 21   Number of Visits 36   Date for PT Re-Evaluation 05/04/15   PT Start Time 0900   PT Stop Time 0950   PT Time Calculation (min) 50 min   Activity Tolerance Patient tolerated treatment well   Behavior During Therapy [facility] for tasks assessed/performed      No past medical history on file.  No past surgical history on file.  There were no vitals filed for this visit.  Visit Diagnosis:  Right shoulder pain  Shoulder stiffness, right      Subjective Assessment - 03/27/15 0907    Subjective Reports shoulder throbbing today and woke him up this morning.   Limitations Lifting   Patient Stated Goals Return right shoulder to previous level of function.   Currently in Pain? Yes   Pain Score 6    Pain Location Shoulder   Pain Orientation Right   Pain Descriptors / Indicators Throbbing   Pain Type Surgical pain   Pain Onset More than a month ago            [facility] PT Assessment - 03/27/15 0001    Assessment   Medical Diagnosis Right shoulder RCR.   Onset Date/Surgical Date 12/25/14   Next MD Visit 04/27/2015                     [facility] Adult PT Treatment/Exercise - 03/27/15 0001    Shoulder Exercises: Seated   Flexion AROM;Right;20 reps  Reported grabbing R ant shoulder pain   Other Seated Exercises R shoulder AROM scaption x20 reps  Continued to report grabbing R ant shoulder pain   Shoulder Exercises: Prone   Retraction AROM;Right;20 reps   Extension AROM;Right;20 reps   Shoulder Exercises: Sidelying   External Rotation AROM;Right;20 reps   Shoulder Exercises: Standing   External  Rotation Strengthening;Right;Theraband;Other (comment)  x25 reps   Theraband Level (Shoulder External Rotation) Level 1 (Yellow)   Internal Rotation Strengthening;Right;Theraband;Other (comment)  x25 reps   Theraband Level (Shoulder Internal Rotation) Level 1 (Yellow)   Row Strengthening;Right;Other (comment)  x25 reps   Theraband Level (Shoulder Row) Level 1 (Yellow)   Shoulder Exercises: Pulleys   Flexion 3 minutes   Other Pulley Exercises UE Ranger flex/circles x20 reps   Shoulder Exercises: ROM/Strengthening   UBE (Upper Arm Bike) x6 min   Rhythmic Stabilization, Supine Flex/ext 6 x30 sec, ER/IR 6 x30 sec   Modalities   Modalities Ultrasound   Ultrasound   Ultrasound Location R ant shoulder   Ultrasound Parameters 1.5 w/cm2, 100%, 1 mhz   Ultrasound Goals Pain                     PT Long Term Goals - 02/13/15 1247    PT LONG TERM GOAL #1   Title Ind with a HEP.   Time 6   Period Weeks   Status On-going   PT LONG TERM GOAL #2   Title Active right shoulder flexion to 155 degrees+.   Time 6   Period Weeks   Status On-going   PT LONG TERM GOAL #3   Title Active  ER to 80 degrees.   Time 6   Period Weeks   Status On-going   PT LONG TERM GOAL #4   Title Patient reach behind back to L2 with right hand.   Time 6   Period Weeks   Status On-going   PT LONG TERM GOAL #5   Title Right shoulder strength= 5/5.   Period Weeks   Status On-going   PT LONG TERM GOAL #6   Title Patient able to perform ADL's with right shoulder pain not > 3/10.   Time 6   Period Weeks   Status On-going               Plan - 03/27/15 1610    Clinical Impression Statement Patient did fairly well today during treatment although he reported grabbing R anterior shoulder pain and throbbing. Continues to tolerate R shoulder rhythmic stabilizations well. Iontophoresis was to be initiated today but patient had testosterone patch applied to posterior R shoulder. MPT was contacted via  telecommunications and approved of delaying iontophoresis treatment until next week secondary to avoiding any negetive effects. Normal [place] response noted following end of [place] session. Experienced 5/10 throbbing pain following treatment.   Pt will benefit from skilled therapeutic intervention in order to improve on the following deficits Pain;Decreased activity tolerance;Decreased range of motion;Decreased strength   Rehab Potential Excellent   Clinical Impairments Affecting Rehab Potential surgury 12/25/14 current 12 weeks 03/19/15   PT Frequency 3x / week   PT Duration 6 weeks   PT Treatment/Interventions ADLs/Self Care Home Management;Cryotherapy;Electrical Stimulation;Moist Heat;Ultrasound;Therapeutic activities;Therapeutic exercise;Neuromuscular re-education;Patient/family education;Manual techniques;Passive range of motion;Iontophoresis 4mg /ml Dexamethasone   PT Next Visit Plan Continue to progress per MD. [name].  Please begin Ionto patch.        Problem List There are no active problems to display for this patient.   Florence Canner, PTA 03/27/2015 10:01 AM  [facility] [address] [place], [place], [address] Phone: [phone]   Fax:  [phone]

## 2015-03-30 ENCOUNTER — Ambulatory Visit: Payer: Worker's Compensation | Admitting: Physical Therapy

## 2015-03-30 ENCOUNTER — Encounter: Payer: Self-pay | Admitting: Physical Therapy

## 2015-03-30 DIAGNOSIS — M25611 Stiffness of right shoulder, not elsewhere classified: Secondary | ICD-10-CM

## 2015-03-30 DIAGNOSIS — M25511 Pain in right shoulder: Secondary | ICD-10-CM | POA: Diagnosis not present

## 2015-03-30 NOTE — Therapy (Signed)
[facility] [place], [place], [address] Phone: [phone]   Fax:  [phone]  Physical Therapy Treatment  Patient Details  Name: Jeffrey Logan MRN: [id number] Date of Birth: 1955-10-19 Referring Provider:  Melrose Nakayama, MD  Encounter Date: 03/30/2015      PT End of Session - 03/30/15 0758    Visit Number 22   Number of Visits 36   Date for PT Re-Evaluation 05/04/15   PT Start Time 0728   PT Stop Time 0814   PT Time Calculation (min) 46 min   Activity Tolerance Patient tolerated treatment well   Behavior During Therapy [facility] for tasks assessed/performed      History reviewed. No pertinent past medical history.  History reviewed. No pertinent past surgical history.  There were no vitals filed for this visit.  Visit Diagnosis:  Right shoulder pain  Shoulder stiffness, right      Subjective Assessment - 03/30/15 0732    Subjective very little pain today per patient    Limitations Lifting   Patient Stated Goals Return right shoulder to previous level of function.   Currently in Pain? Yes   Pain Score 2    Pain Location Shoulder   Pain Orientation Right   Pain Descriptors / Indicators Throbbing   Pain Type Surgical pain   Pain Onset More than a month ago   Pain Frequency Intermittent   Aggravating Factors  increased activity in shoulder   Pain Relieving Factors rest                         [facility] Adult PT Treatment/Exercise - 03/30/15 0001    Shoulder Exercises: Prone   Retraction Strengthening;Right;Weights  3x10   Retraction Weight (lbs) 2   Extension Strengthening;Right;Weights  3x10   Extension Weight (lbs) 2   Shoulder Exercises: Sidelying   External Rotation Strengthening;Right;Weights  2x10   External Rotation Weight (lbs) 2   Shoulder Exercises: Standing   External Rotation Strengthening;Right;Theraband  3x10   Theraband Level (Shoulder External Rotation) Level 1 (Yellow)    Internal Rotation Strengthening;Right;Theraband  3x10   Theraband Level (Shoulder Internal Rotation) Level 1 (Yellow)   Flexion Strengthening;Right;Weights  3x10 SCAPTION   Shoulder Flexion Weight (lbs) 1   Extension Right;10 reps;Other (comment)  3x10   Theraband Level (Shoulder Extension) Level 1 (Yellow)   Row Strengthening;Right;Theraband  3x10   Theraband Level (Shoulder Row) Level 1 (Yellow)   Shoulder Exercises: Pulleys   Flexion 3 minutes   Other Pulley Exercises UE Ranger eleation/circles x20 reps   Shoulder Exercises: ROM/Strengthening   UBE (Upper Arm Bike) 90 RPM x32mn   Iontophoresis   Type of Iontophoresis Dexamethasone   Location right ant shoulder   Dose 2.015m@ 33m32ml 1 of 6   Time 8                     PT Long Term Goals - 03/30/15 0758    PT LONG TERM GOAL #1   Title Ind with a HEP.   Time 6   Period Weeks   Status On-going   PT LONG TERM GOAL #2   Title Active right shoulder flexion to 155 degrees+.   Time 6   Period Weeks   Status On-going  130 degrees   PT LONG TERM GOAL #3   Title Active ER to 80 degrees.   Time 6   Period Weeks   Status On-going   PT  LONG TERM GOAL #4   Title Patient reach behind back to L2 with right hand.   Time 6   Period Weeks   Status Achieved  T12   PT LONG TERM GOAL #5   Title Right shoulder strength= 5/5.   Time 6   Period Weeks   Status On-going   PT LONG TERM GOAL #6   Title Patient able to perform ADL's with right shoulder pain not > 3/10.   Time 6   Period Weeks   Status On-going               Plan - 03/30/15 0800    Clinical Impression Statement Patient continues to progress with all activities. Patient had no pain increase with exercises and was able to progress weights today with no difficulty. Patient able to reach behind back to T12 and met LTG #4, other goals ongoing due to pain, ROM and strength deficits.   Pt will benefit from skilled therapeutic intervention in order to  improve on the following deficits Pain;Decreased activity tolerance;Decreased range of motion;Decreased strength   Clinical Impairments Affecting Rehab Potential surgury 12/25/14 current 13 weeks 03/26/15   PT Frequency 3x / week   PT Duration 6 weeks   PT Next Visit Plan Continue to progress per MD/MPT (MD. Latanya Maudlin 04/27/15)   Consulted and Agree with Plan of Care Patient        Problem List There are no active problems to display for this patient.   Phillips Climes, PTA 03/30/2015, 8:15 AM  [facility] [place], [place], [address] Phone: [phone]   Fax:  [phone]

## 2015-04-01 ENCOUNTER — Encounter: Payer: Self-pay | Admitting: Physical Therapy

## 2015-04-03 ENCOUNTER — Encounter: Payer: Self-pay | Admitting: Physical Therapy

## 2015-04-09 ENCOUNTER — Encounter: Payer: Self-pay | Admitting: Physical Therapy

## 2015-04-09 ENCOUNTER — Ambulatory Visit: Payer: Worker's Compensation | Attending: Orthopaedic Surgery | Admitting: Physical Therapy

## 2015-04-09 DIAGNOSIS — M25511 Pain in right shoulder: Secondary | ICD-10-CM | POA: Insufficient documentation

## 2015-04-09 DIAGNOSIS — M25611 Stiffness of right shoulder, not elsewhere classified: Secondary | ICD-10-CM | POA: Diagnosis present

## 2015-04-09 DIAGNOSIS — M6281 Muscle weakness (generalized): Secondary | ICD-10-CM | POA: Diagnosis present

## 2015-04-09 NOTE — Therapy (Signed)
[facility] [place], [place], [address] Phone: [phone]   Fax:  [phone]  Physical Therapy Treatment  Patient Details  Name: Jeffrey Logan MRN: [id number] Date of Birth: 03-15-1956 Referring Provider:  Melrose Nakayama, MD  Encounter Date: 04/09/2015      PT End of Session - 04/09/15 0752    Visit Number 23   Number of Visits 36   Date for PT Re-Evaluation 05/04/15   PT Start Time 0728   PT Stop Time 0815   PT Time Calculation (min) 47 min   Activity Tolerance Patient tolerated treatment well   Behavior During Therapy [facility] for tasks assessed/performed      History reviewed. No pertinent past medical history.  History reviewed. No pertinent past surgical history.  There were no vitals filed for this visit.  Visit Diagnosis:  Right shoulder pain  Shoulder stiffness, right      Subjective Assessment - 04/09/15 0730    Subjective very little pain today per patient / ionto did great and lasted a few hours   Limitations Lifting   Patient Stated Goals Return right shoulder to previous level of function.   Currently in Pain? Yes   Pain Score 2    Pain Location Shoulder   Pain Orientation Right   Pain Descriptors / Indicators Throbbing   Pain Type Surgical pain   Pain Onset More than a month ago   Pain Frequency Intermittent   Aggravating Factors  increased activity   Pain Relieving Factors rest            [facility] PT Assessment - 04/09/15 0001    AROM   Overall AROM  Deficits;Within functional limits for tasks performed   Overall AROM Comments 133 Flexion, 85 ER   PROM   Overall PROM  --   PROM Assessment Site --   Right/Left Shoulder --                     [facility] Adult PT Treatment/Exercise - 04/09/15 0001    Shoulder Exercises: Prone   Retraction Strengthening;Right;Weights  3x10   Retraction Weight (lbs) 2   Extension Strengthening;Right;Weights  3x10   Extension Weight (lbs) 2   Shoulder Exercises: Standing   External Rotation Strengthening;Right;Theraband   Theraband Level (Shoulder External Rotation) Level 1 (Yellow)   Internal Rotation Strengthening;Right;Theraband   Theraband Level (Shoulder Internal Rotation) Level 1 (Yellow)   Flexion Strengthening;Right;Weights  2x10   Shoulder Flexion Weight (lbs) 2   Extension Right;10 reps;Other (comment)   Theraband Level (Shoulder Extension) Level 1 (Yellow)   Row Strengthening;Right;Theraband   Theraband Level (Shoulder Row) Level 1 (Yellow)   Shoulder Exercises: Pulleys   Flexion --  49mn   Other Pulley Exercises UE Ranger eleation/circles x20 reps   Shoulder Exercises: ROM/Strengthening   UBE (Upper Arm Bike) 90 RPM x634m   Iontophoresis   Type of Iontophoresis Dexamethasone   Location right ant shoulder   Dose 1.19m44m 4mg58m 2 of 6   Time 8                     PT Long Term Goals - 04/09/15 0748    PT LONG TERM GOAL #1   Title Ind with a HEP.   Time 6   Period Weeks   Status On-going   PT LONG TERM GOAL #2   Title Active right shoulder flexion to 155 degrees+.   Time 6   Period Weeks  Status On-going  133 degrees   PT LONG TERM GOAL #3   Title Active ER to 80 degrees.   Time 6   Period Weeks   Status Achieved  85 degrees   PT LONG TERM GOAL #4   Title Patient reach behind back to L2 with right hand.   Time 6   Period Weeks   Status Achieved   PT LONG TERM GOAL #5   Title Right shoulder strength= 5/5.   Time 6   Period Weeks   Status On-going   PT LONG TERM GOAL #6   Title Patient able to perform ADL's with right shoulder pain not > 3/10.   Time 6   Period Weeks   Status On-going  unable to perform ADL's               Plan - 04/09/15 0802    Clinical Impression Statement Patient continues to progress with all activities. Patient improved AROM for both right shoulder flexion and ER today. Patient has reported good response to ionto patch and has little pain  overall. Patient has met LTG #3 and others ongoing due to pain, ROM and strength deficits.   Pt will benefit from skilled therapeutic intervention in order to improve on the following deficits Pain;Decreased activity tolerance;Decreased range of motion;Decreased strength   Rehab Potential Excellent   Clinical Impairments Affecting Rehab Potential surgury 12/25/14 current 13 weeks 03/26/15   PT Frequency 3x / week   PT Duration 6 weeks   PT Treatment/Interventions ADLs/Self Care Home Management;Cryotherapy;Electrical Stimulation;Moist Heat;Ultrasound;Therapeutic activities;Therapeutic exercise;Neuromuscular re-education;Patient/family education;Manual techniques;Passive range of motion;Iontophoresis 39m/ml Dexamethasone   PT Next Visit Plan Continue to progress per MD/MPT (MD. DLatanya Maudlin9/26/16)   Consulted and Agree with Plan of Care Patient        Problem List There are no active problems to display for this patient.   DPhillips Climes PTA 04/09/2015, 8:19 AM  [facility] [address]Phone: [phone]  Fax:  [phone]

## 2015-04-10 ENCOUNTER — Ambulatory Visit: Payer: Worker's Compensation | Admitting: Physical Therapy

## 2015-04-10 DIAGNOSIS — M25511 Pain in right shoulder: Secondary | ICD-10-CM

## 2015-04-10 DIAGNOSIS — M6281 Muscle weakness (generalized): Secondary | ICD-10-CM

## 2015-04-10 DIAGNOSIS — M25611 Stiffness of right shoulder, not elsewhere classified: Secondary | ICD-10-CM

## 2015-04-10 NOTE — Addendum Note (Signed)
Addended by: Gearlean Alf on: 04/10/2015 09:02 AM   Modules accepted: Orders

## 2015-04-10 NOTE — Therapy (Signed)
[facility] [address] [place], [place], [address] Phone: [phone]   Fax:  [phone]  Physical Therapy Treatment  Patient Details  Name: Jeffrey Logan MRN: [id number] Date of Birth: Nov 05, 1955 Referring Provider:  Marcene Corning, MD  Encounter Date: 04/10/2015      PT End of Session - 04/10/15 1041    Visit Number 24   Number of Visits 36   Date for PT Re-Evaluation 05/04/15   PT Start Time 1033   PT Stop Time 1113   PT Time Calculation (min) 40 min   Activity Tolerance Patient tolerated treatment well   Behavior During Therapy [facility] for tasks assessed/performed      No past medical history on file.  No past surgical history on file.  There were no vitals filed for this visit.  Visit Diagnosis:  Muscle weakness of right upper extremity  Right shoulder pain  Shoulder stiffness, right      Subjective Assessment - 04/10/15 1042    Subjective a little more pain today. Patient reports the humidity bothers him or return to therapy.   Currently in Pain? Yes   Pain Score 5    Pain Location Shoulder   Pain Orientation Right   Pain Descriptors / Indicators Throbbing   Pain Type Surgical pain            [facility] PT Assessment - 04/10/15 0001    Assessment   Next MD Visit 04/27/2015                     [facility] Adult PT Treatment/Exercise - 04/10/15 0001    Shoulder Exercises: Supine   Other Supine Exercises PNF D1/D2 flex/ext with PT resistance x 20 each   Shoulder Exercises: Standing   Horizontal ABduction Strengthening;Right;15 reps   Horizontal ABduction Weight (lbs) 2   External Rotation Strengthening;Right;10 reps;Theraband  x3   Theraband Level (Shoulder External Rotation) Level 1 (Yellow)   Internal Rotation Strengthening;Right;10 reps;Theraband  x3   Theraband Level (Shoulder Internal Rotation) Level 1 (Yellow)  attempted red, increased pain down arm   Flexion Strengthening;Right;Weights  2x10    Theraband Level (Shoulder Flexion) Level 1 (Yellow)   Shoulder Flexion Weight (lbs) 2   ABduction Strengthening;Right;10 reps;Limitations   Shoulder ABduction Weight (lbs) 0   Extension Right;10 reps;Other (comment)  x3   Theraband Level (Shoulder Extension) Level 1 (Yellow)   Row Strengthening;Right;20 reps;Weights   Row Weight (lbs) 2   Other Standing Exercises Robber x 20 2#   Shoulder Exercises: Pulleys   Flexion --  5   Other Pulley Exercises UE Ranger eleation/circles x20 reps   Shoulder Exercises: ROM/Strengthening   UBE (Upper Arm Bike) 90 RPM x26min   Ball on Wall 2x30 one with red ball; one with 2# ball   Iontophoresis   Type of Iontophoresis Dexamethasone   Location R ant shoulder   Dose 1.0 cc /ml 3 of 6   Time 8                     PT Long Term Goals - 04/09/15 6962    PT LONG TERM GOAL #1   Title Ind with a HEP.   Time 6   Period Weeks   Status On-going   PT LONG TERM GOAL #2   Title Active right shoulder flexion to 155 degrees+.   Time 6   Period Weeks   Status On-going  133 degrees   PT LONG TERM GOAL #3  Title Active ER to 80 degrees.   Time 6   Period Weeks   Status Achieved  85 degrees   PT LONG TERM GOAL #4   Title Patient reach behind back to L2 with right hand.   Time 6   Period Weeks   Status Achieved   PT LONG TERM GOAL #5   Title Right shoulder strength= 5/5.   Time 6   Period Weeks   Status On-going   PT LONG TERM GOAL #6   Title Patient able to perform ADL's with right shoulder pain not > 3/10.   Time 6   Period Weeks   Status On-going  unable to perform ADL's               Plan - 04/10/15 1118    Clinical Impression Statement Patient tolerated therex, but was unable to increase resistance on most exercises due c/o pain and popping in shoulder. He reports some increase in pain today which he attributes to the humidity and to taking a week off of therapy last week with no exercising.    PT Next Visit  Plan continue PNF, upper back strengthening (robber, Ts, Ys, rows). MD 04/27/15        Problem List There are no active problems to display for this patient.   Solon Palm PT  04/10/2015, 11:23 AM  [facility] [address] [place], [place], [address] Phone: [phone]   Fax:  [phone]

## 2015-04-14 ENCOUNTER — Encounter: Payer: Self-pay | Admitting: *Deleted

## 2015-04-14 ENCOUNTER — Ambulatory Visit: Payer: Worker's Compensation | Admitting: *Deleted

## 2015-04-14 DIAGNOSIS — M25611 Stiffness of right shoulder, not elsewhere classified: Secondary | ICD-10-CM

## 2015-04-14 DIAGNOSIS — M25511 Pain in right shoulder: Secondary | ICD-10-CM | POA: Diagnosis not present

## 2015-04-14 DIAGNOSIS — M6281 Muscle weakness (generalized): Secondary | ICD-10-CM

## 2015-04-14 NOTE — Therapy (Signed)
[facility] [address] [place], [place], [address] Phone: [phone]   Fax:  [phone]  Physical Therapy Treatment  Patient Details  Name: Jeffrey Logan MRN: [id number] Date of Birth: 1955-08-03 Referring Provider:  Marcene Corning, MD  Encounter Date: 04/14/2015      PT End of Session - 04/14/15 1610    Visit Number 25   Number of Visits 36   Date for PT Re-Evaluation 05/04/15   PT Start Time 1600   PT Stop Time 1649   PT Time Calculation (min) 49 min      History reviewed. No pertinent past medical history.  History reviewed. No pertinent past surgical history.  There were no vitals filed for this visit.  Visit Diagnosis:  Right shoulder pain  Shoulder stiffness, right  Muscle weakness of right upper extremity      Subjective Assessment - 04/14/15 1607    Subjective Rt shldr doing ok. Sore 4/10   Limitations Lifting   Patient Stated Goals Return right shoulder to previous level of function.   Currently in Pain? Yes   Pain Score 4    Pain Orientation Right   Pain Descriptors / Indicators Throbbing   Pain Type Surgical pain   Pain Onset More than a month ago   Pain Frequency Intermittent   Aggravating Factors  increased activity   Pain Relieving Factors rest                         [facility] Adult PT Treatment/Exercise - 04/14/15 0001    Exercises   Exercises Shoulder   Shoulder Exercises: Supine   Other Supine Exercises 2# ball circles each way 3x10 supine   Shoulder Exercises: Prone   Retraction Strengthening;Right;Weights  3x10   Retraction Weight (lbs) 2   Extension Strengthening;Right;Weights  3x10   Extension Weight (lbs) 2   Shoulder Exercises: Sidelying   External Rotation Strengthening;Right;Weights  2x10   External Rotation Weight (lbs) 1   Shoulder Exercises: Standing   External Rotation Strengthening;Right;10 reps;Theraband  x3   Theraband Level (Shoulder External Rotation)  Level 1 (Yellow)   Internal Rotation Strengthening;Right;10 reps;Theraband  x3   Theraband Level (Shoulder Internal Rotation) Level 1 (Yellow)  attempted red, increased pain down arm   Flexion Strengthening;Right;Weights  2x10  Work up to 5 x 10   Shoulder Flexion Weight (lbs) 1   Shoulder Exercises: Pulleys   Other Pulley Exercises UE Ranger elevation/circles each x30 reps   Shoulder Exercises: ROM/Strengthening   UBE (Upper Arm Bike) 90 RPM x31min   Ball on Wall Red ball 3x10 each way   Iontophoresis   Type of Iontophoresis Dexamethasone   Location R ant shoulder   Dose 1.0 cc /ml 3 of 6   Time 8   Manual Therapy   Manual Therapy Other (comment)  Supine PNF D1/D2  manual resistance                     PT Long Term Goals - 04/09/15 0748    PT LONG TERM GOAL #1   Title Ind with a HEP.   Time 6   Period Weeks   Status On-going   PT LONG TERM GOAL #2   Title Active right shoulder flexion to 155 degrees+.   Time 6   Period Weeks   Status On-going  133 degrees   PT LONG TERM GOAL #3   Title Active ER to 80 degrees.   Time  6   Period Weeks   Status Achieved  85 degrees   PT LONG TERM GOAL #4   Title Patient reach behind back to L2 with right hand.   Time 6   Period Weeks   Status Achieved   PT LONG TERM GOAL #5   Title Right shoulder strength= 5/5.   Time 6   Period Weeks   Status On-going   PT LONG TERM GOAL #6   Title Patient able to perform ADL's with right shoulder pain not > 3/10.   Time 6   Period Weeks   Status On-going  unable to perform ADL's               Plan - 04/14/15 1611    Clinical Impression Statement Pt did fairly well today with exs and mainly had increased pain and popping with standing elevation. We decreased wt. to 1# today. Pt's RT ACJ feels that it is popping when palpated during elevation. RT bicep tendon is also sore when palpated. Goals are ongoing   Pt will benefit from skilled therapeutic intervention in  order to improve on the following deficits Pain;Decreased activity tolerance;Decreased range of motion;Decreased strength   Rehab Potential Excellent   PT Frequency 3x / week   PT Duration 6 weeks   PT Treatment/Interventions ADLs/Self Care Home Management;Cryotherapy;Electrical Stimulation;Moist Heat;Ultrasound;Therapeutic activities;Therapeutic exercise;Neuromuscular re-education;Patient/family education;Manual techniques;Passive range of motion;Iontophoresis 4mg /ml Dexamethasone   PT Next Visit Plan continue PNF, upper back strengthening (robber, Ts, Ys, rows). MD 04/27/15   Consulted and Agree with Plan of Care Patient        Problem List There are no active problems to display for this patient.   Lindzey Zent,CHRIS, PTA 04/14/2015, 5:10 PM  [facility] [address] [place], [place], [address] Phone: [phone]   Fax:  [phone]

## 2015-04-15 ENCOUNTER — Encounter: Payer: Self-pay | Admitting: Physical Therapy

## 2015-04-15 ENCOUNTER — Ambulatory Visit: Payer: Worker's Compensation | Admitting: Physical Therapy

## 2015-04-15 DIAGNOSIS — M25611 Stiffness of right shoulder, not elsewhere classified: Secondary | ICD-10-CM

## 2015-04-15 DIAGNOSIS — M25511 Pain in right shoulder: Secondary | ICD-10-CM | POA: Diagnosis not present

## 2015-04-15 DIAGNOSIS — M6281 Muscle weakness (generalized): Secondary | ICD-10-CM

## 2015-04-15 NOTE — Therapy (Signed)
[facility] [address] [place], [place], [address] Phone: [phone]   Fax:  [phone]  Physical Therapy Treatment  Patient Details  Name: Jeffrey Logan MRN: [id number] Date of Birth: [birth year] Referring Provider:  Marcene Corning, MD  Encounter Date: 04/15/2015      PT End of Session - 04/15/15 1418    Visit Number 26   Number of Visits 36   Date for PT Re-Evaluation 05/04/15   PT Start Time 1359   PT Stop Time 1440   PT Time Calculation (min) 41 min   Activity Tolerance Patient tolerated treatment well      History reviewed. No pertinent past medical history.  History reviewed. No pertinent past surgical history.  There were no vitals filed for this visit.  Visit Diagnosis:  Right shoulder pain  Shoulder stiffness, right  Muscle weakness of right upper extremity      Subjective Assessment - 04/15/15 1400    Subjective no new complaints per patient   Limitations Lifting   Patient Stated Goals Return right shoulder to previous level of function.   Currently in Pain? Yes   Pain Score 3    Pain Location Shoulder   Pain Orientation Right   Pain Descriptors / Indicators Throbbing   Pain Type Surgical pain   Pain Onset More than a month ago   Pain Frequency Intermittent   Aggravating Factors  increased activity in shoulder   Pain Relieving Factors rest            [facility] PT Assessment - 04/15/15 0001    AROM   Overall AROM  Deficits   Overall AROM Comments 135 flexion                     [facility] Adult PT Treatment/Exercise - 04/15/15 0001    Shoulder Exercises: Prone   Retraction Strengthening;Right;Weights  3x10   Retraction Weight (lbs) 2   Extension Strengthening;Right;Weights  3x10   Extension Weight (lbs) 2   Horizontal ABduction 1 Strengthening;Right;Weights  3x10   Horizontal ABduction 1 Weight (lbs) 0   Shoulder Exercises: Sidelying   External Rotation Strengthening;Right;Weights   3x10   External Rotation Weight (lbs) 2   Shoulder Exercises: Standing   External Rotation Strengthening;Right;Theraband   Theraband Level (Shoulder External Rotation) Level 1 (Yellow)   Internal Rotation Strengthening;Right;Theraband  3x10   Theraband Level (Shoulder Internal Rotation) Level 1 (Yellow)   Flexion Strengthening;Right;Weights  3x10   Shoulder Flexion Weight (lbs) 2   Extension Strengthening;Right;Theraband  3x10   Theraband Level (Shoulder Extension) Level 1 (Yellow)   Row Strengthening;Right;Theraband  3x10   Theraband Level (Shoulder Row) Level 1 (Yellow)   Other Standing Exercises wall slide with eccentric lowering 2 x10   Shoulder Exercises: Pulleys   Other Pulley Exercises UE Ranger elevation/circles each x30 reps   Shoulder Exercises: ROM/Strengthening   UBE (Upper Arm Bike) 90 RPM x63min   Wall Pushups --  2x10   Iontophoresis   Time --  OUT of dexamethasone / will cont next treatment                     PT Long Term Goals - 04/15/15 1434    PT LONG TERM GOAL #1   Title Ind with a HEP.   Time 6   Period Weeks   Status On-going   PT LONG TERM GOAL #2   Title Active right shoulder flexion to 155 degrees+.   Time 6  Period Weeks   Status --  AROM 135 degrees   PT LONG TERM GOAL #3   Title Active ER to 80 degrees.   Time 6   Period Weeks   Status Achieved   PT LONG TERM GOAL #4   Title Patient reach behind back to L2 with right hand.   Time 6   Period Weeks   Status Achieved   PT LONG TERM GOAL #5   Title Right shoulder strength= 5/5.   Time 6   Period Weeks   Status On-going   PT LONG TERM GOAL #6   Title Patient able to perform ADL's with right shoulder pain not > 3/10.   Time 6   Period Weeks   Status On-going               Plan - 04/15/15 1430    Clinical Impression Statement Patient progressing with all activities today. Patient has improved with AROM in right shoulder by 2 degrees since last measurement  (AROM 135 right shoulder flexion). Patient has noticed some popping in shoulder with certain movements. Unable to meet any further hoals today due to pain, strength and ROM deficits.    Pt will benefit from skilled therapeutic intervention in order to improve on the following deficits Pain;Decreased activity tolerance;Decreased range of motion;Decreased strength   Rehab Potential Excellent   Clinical Impairments Affecting Rehab Potential surgury 12/25/14 current 13 weeks 03/26/15   PT Frequency 3x / week   PT Duration 6 weeks   PT Treatment/Interventions ADLs/Self Care Home Management;Cryotherapy;Electrical Stimulation;Moist Heat;Ultrasound;Therapeutic activities;Therapeutic exercise;Neuromuscular re-education;Patient/family education;Manual techniques;Passive range of motion;Iontophoresis 4mg /ml Dexamethasone   PT Next Visit Plan continue with POC( MD 04/27/15)   Consulted and Agree with Plan of Care Patient        Problem List There are no active problems to display for this patient.   [name], PTA 04/15/2015, 2:47 PM  [facility] [address] [place], [place], [address] Phone: [phone]   Fax:  [phone]

## 2015-04-17 ENCOUNTER — Ambulatory Visit: Payer: Worker's Compensation | Admitting: *Deleted

## 2015-04-17 ENCOUNTER — Encounter: Payer: Self-pay | Admitting: *Deleted

## 2015-04-17 DIAGNOSIS — M25511 Pain in right shoulder: Secondary | ICD-10-CM

## 2015-04-17 DIAGNOSIS — M6281 Muscle weakness (generalized): Secondary | ICD-10-CM

## 2015-04-17 DIAGNOSIS — M25611 Stiffness of right shoulder, not elsewhere classified: Secondary | ICD-10-CM

## 2015-04-17 NOTE — Therapy (Signed)
[facility] [address] [place], [place], [address] Phone: [phone]   Fax:  [phone]  Physical Therapy Treatment  Patient Details  Name: Jeffrey Logan MRN: [id number] Date of Birth: March 26, 1956 Referring Provider:  Marcene Corning, MD  Encounter Date: 04/17/2015      PT End of Session - 04/17/15 1043    Visit Number 27   Number of Visits 36   Date for PT Re-Evaluation 05/04/15   PT Start Time 1030   PT Stop Time 1119   PT Time Calculation (min) 49 min      History reviewed. No pertinent past medical history.  History reviewed. No pertinent past surgical history.  There were no vitals filed for this visit.  Visit Diagnosis:  Right shoulder pain  Shoulder stiffness, right  Muscle weakness of right upper extremity      Subjective Assessment - 04/17/15 1039    Subjective RT shldr keeps popping. Reached across to LT shldr and it popped   Limitations Lifting   Patient Stated Goals Return right shoulder to previous level of function.   Currently in Pain? Yes   Pain Score 4    Pain Location Shoulder   Pain Orientation Right   Pain Type Surgical pain   Pain Onset More than a month ago   Pain Frequency Intermittent   Aggravating Factors  raising RT arm out to side   Pain Relieving Factors rest                         [facility] Adult PT Treatment/Exercise - 04/17/15 0001    Exercises   Exercises Shoulder   Shoulder Exercises: Supine   Other Supine Exercises 2# ball circles each way 3x10 supine   Shoulder Exercises: Prone   Retraction Strengthening;Right;Weights  4x10  ROWING motion   Retraction Weight (lbs) 2   Extension Strengthening;Right;Weights  4x10   Extension Weight (lbs) 2   Horizontal ABduction 1 Strengthening;Right;Weights  3x10   Shoulder Exercises: Sidelying   External Rotation Strengthening;Right;Weights  4x10   External Rotation Weight (lbs) 2   Shoulder Exercises: Standing   External Rotation Strengthening;Right;Theraband   Internal Rotation Strengthening;Right;Theraband  3x10   Flexion Strengthening;Right;Weights  4x10   Shoulder Flexion Weight (lbs) 1   Shoulder Exercises: Pulleys   Other Pulley Exercises UE Ranger elevation/circles each x30 reps   Shoulder Exercises: ROM/Strengthening   UBE (Upper Arm Bike) 90 RPM x22min   Iontophoresis   Type of Iontophoresis Dexamethasone   Location R ant shoulder   Dose 1.0 cc 4mg /ml 3 of 6                     PT Long Term Goals - 04/15/15 1434    PT LONG TERM GOAL #1   Title Ind with a HEP.   Time 6   Period Weeks   Status On-going   PT LONG TERM GOAL #2   Title Active right shoulder flexion to 155 degrees+.   Time 6   Period Weeks   Status --  AROM 135 degrees   PT LONG TERM GOAL #3   Title Active ER to 80 degrees.   Time 6   Period Weeks   Status Achieved   PT LONG TERM GOAL #4   Title Patient reach behind back to L2 with right hand.   Time 6   Period Weeks   Status Achieved   PT LONG TERM GOAL #5   Title  Right shoulder strength= 5/5.   Time 6   Period Weeks   Status On-going   PT LONG TERM GOAL #6   Title Patient able to perform ADL's with right shoulder pain not > 3/10.   Time 6   Period Weeks   Status On-going               Plan - 04/17/15 1103    Clinical Impression Statement Pt did fairly well today with exs and act.'s for RT shldr. We progressed exs to 4 sets of 10 on most PREs. Having to modify some exs due to RT ACJ popping and pain. Ionto patch placed over ACJ. Goals are ongoing   Pt will benefit from skilled therapeutic intervention in order to improve on the following deficits Pain;Decreased activity tolerance;Decreased range of motion;Decreased strength   Rehab Potential Excellent   Clinical Impairments Affecting Rehab Potential surgury 12/25/14 current 13 weeks 03/26/15   PT Duration 6 weeks   PT Treatment/Interventions ADLs/Self Care Home  Management;Cryotherapy;Electrical Stimulation;Moist Heat;Ultrasound;Therapeutic activities;Therapeutic exercise;Neuromuscular re-education;Patient/family education;Manual techniques;Passive range of motion;Iontophoresis /ml Dexamethasone   PT Next Visit Plan continue with POC( MD 04/27/15)   Consulted and Agree with Plan of Care Patient        Problem List There are no active problems to display for this patient.   RAMSEUR,CHRIS, PTA 04/17/2015, 12:45 PM  [facility] [address] [place], [place], [address] Phone: [phone]   Fax:  [phone]

## 2015-04-20 ENCOUNTER — Ambulatory Visit: Payer: Worker's Compensation | Admitting: Physical Therapy

## 2015-04-20 DIAGNOSIS — M6281 Muscle weakness (generalized): Secondary | ICD-10-CM

## 2015-04-20 DIAGNOSIS — M25511 Pain in right shoulder: Secondary | ICD-10-CM | POA: Diagnosis not present

## 2015-04-20 DIAGNOSIS — M25611 Stiffness of right shoulder, not elsewhere classified: Secondary | ICD-10-CM

## 2015-04-20 NOTE — Therapy (Signed)
[facility] [address] [place], [place], [address] Phone: [phone]   Fax:  [phone]  Physical Therapy Treatment  Patient Details  Name: Jeffrey Logan MRN: [id number] Date of Birth: 09-Jan-1956 Referring Provider:  Marcene Corning, MD  Encounter Date: 04/20/2015      PT End of Session - 04/20/15 1428    Visit Number 28   Number of Visits 36   Date for PT Re-Evaluation 05/04/15   PT Start Time 1426   PT Stop Time 1504   PT Time Calculation (min) 38 min   Activity Tolerance Patient tolerated treatment well   Behavior During Therapy [facility] for tasks assessed/performed      No past medical history on file.  No past surgical history on file.  There were no vitals filed for this visit.  Visit Diagnosis:  Right shoulder pain  Shoulder stiffness, right  Muscle weakness of right upper extremity      Subjective Assessment - 04/20/15 1427    Subjective Reports R shoulder stiffness in the morning and goes away with movement.   Limitations Lifting   Patient Stated Goals Return right shoulder to previous level of function.   Currently in Pain? Yes   Pain Score 4    Pain Location Shoulder   Pain Orientation Right   Pain Descriptors / Indicators Sore   Pain Type Surgical pain   Pain Onset More than a month ago            [facility] PT Assessment - 04/20/15 0001    Assessment   Medical Diagnosis Right shoulder RCR.   Onset Date/Surgical Date 12/25/14   Next MD Visit 04/27/2015                     [facility] Adult PT Treatment/Exercise - 04/20/15 0001    Shoulder Exercises: Prone   Retraction Strengthening;Right;Weights  Row 4x10 reps   Retraction Weight (lbs) 2   Extension Strengthening;Right;Weights  4x10 reps   Extension Weight (lbs) 2   Horizontal ABduction 1 Strengthening;Right  3x10 reps   Other Prone Exercises R full can no weight 3x10 reps   Shoulder Exercises: Sidelying   External Rotation  Strengthening;Right;Weights  4x10 reps   External Rotation Weight (lbs) 2   Shoulder Exercises: Standing   External Rotation Strengthening;Right;Theraband  3x10 reps   Theraband Level (Shoulder External Rotation) Level 2 (Red)   Internal Rotation Strengthening;Right;Theraband  3x10 reps   Theraband Level (Shoulder Internal Rotation) Level 2 (Red)   Flexion Strengthening;Right;Weights  4x10 reps   Shoulder Flexion Weight (lbs) 1   Shoulder Exercises: Pulleys   Other Pulley Exercises UE Ranger elevation/circles each x30 reps   Shoulder Exercises: ROM/Strengthening   UBE (Upper Arm Bike) 60 RPM x6 min                     PT Long Term Goals - 04/15/15 1434    PT LONG TERM GOAL #1   Title Ind with a HEP.   Time 6   Period Weeks   Status On-going   PT LONG TERM GOAL #2   Title Active right shoulder flexion to 155 degrees+.   Time 6   Period Weeks   Status --  AROM 135 degrees   PT LONG TERM GOAL #3   Title Active ER to 80 degrees.   Time 6   Period Weeks   Status Achieved   PT LONG TERM GOAL #4   Title Patient reach  behind back to L2 with right hand.   Time 6   Period Weeks   Status Achieved   PT LONG TERM GOAL #5   Title Right shoulder strength= 5/5.   Time 6   Period Weeks   Status On-going   PT LONG TERM GOAL #6   Title Patient able to perform ADL's with right shoulder pain not > 3/10.   Time 6   Period Weeks   Status On-going               Plan - 04/20/15 1505    Clinical Impression Statement Patient tolerated treatment fairly well today with no reports verbalized about popping and pain was not reported until PTA asked patient and patient reported pain as being "not bad." Completed all exercises with good form and no abnormaliites seen in the motion. Goals remain on-going secondary to R shoulder ROM, strength and pain deficiits. Iontophoresis patch placed over the R AC joint. Experienced 5/10 pain following treatment to which patient  attributed to exercises.   Pt will benefit from skilled therapeutic intervention in order to improve on the following deficits Pain;Decreased activity tolerance;Decreased range of motion;Decreased strength   Rehab Potential Excellent   Clinical Impairments Affecting Rehab Potential surgury 12/25/14 current 13 weeks 03/26/15   PT Frequency 3x / week   PT Duration 6 weeks   PT Treatment/Interventions ADLs/Self Care Home Management;Cryotherapy;Electrical Stimulation;Moist Heat;Ultrasound;Therapeutic activities;Therapeutic exercise;Neuromuscular re-education;Patient/family education;Manual techniques;Passive range of motion;Iontophoresis /ml Dexamethasone   PT Next Visit Plan continue with POC( MD 04/27/15)   Consulted and Agree with Plan of Care Patient        Problem List There are no active problems to display for this patient.   Evelene Croon, PTA 04/20/2015, 3:11 PM  [facility] [address] [place], [place], [address] Phone: [phone]   Fax:  [phone]

## 2015-04-22 ENCOUNTER — Ambulatory Visit (INDEPENDENT_AMBULATORY_CARE_PROVIDER_SITE_OTHER): Payer: Managed Care, Other (non HMO) | Admitting: Family Medicine

## 2015-04-22 ENCOUNTER — Ambulatory Visit: Payer: Self-pay | Admitting: Family Medicine

## 2015-04-22 ENCOUNTER — Ambulatory Visit: Payer: Worker's Compensation | Admitting: Physical Therapy

## 2015-04-22 ENCOUNTER — Encounter: Payer: Self-pay | Admitting: Family Medicine

## 2015-04-22 VITALS — BP 120/78 | HR 62 | Temp 97.8°F | Ht 72.0 in | Wt 191.0 lb

## 2015-04-22 DIAGNOSIS — K2100 Gastro-esophageal reflux disease with esophagitis, without bleeding: Secondary | ICD-10-CM

## 2015-04-22 DIAGNOSIS — R7989 Other specified abnormal findings of blood chemistry: Secondary | ICD-10-CM

## 2015-04-22 DIAGNOSIS — K2270 Barrett's esophagus without dysplasia: Secondary | ICD-10-CM | POA: Insufficient documentation

## 2015-04-22 DIAGNOSIS — Z0000 Encounter for general adult medical examination without abnormal findings: Secondary | ICD-10-CM | POA: Diagnosis not present

## 2015-04-22 DIAGNOSIS — K219 Gastro-esophageal reflux disease without esophagitis: Secondary | ICD-10-CM | POA: Insufficient documentation

## 2015-04-22 DIAGNOSIS — I10 Essential (primary) hypertension: Secondary | ICD-10-CM

## 2015-04-22 DIAGNOSIS — M25511 Pain in right shoulder: Secondary | ICD-10-CM

## 2015-04-22 DIAGNOSIS — M6281 Muscle weakness (generalized): Secondary | ICD-10-CM

## 2015-04-22 DIAGNOSIS — M25611 Stiffness of right shoulder, not elsewhere classified: Secondary | ICD-10-CM

## 2015-04-22 NOTE — Assessment & Plan Note (Signed)
This is being managed by endocrinology currently.

## 2015-04-22 NOTE — Progress Notes (Signed)
BP 120/78 mmHg  Pulse 62  Temp(Src) 97.8 F (36.6 C) (Oral)  Ht 6' (1.829 m)  Wt 191 lb (86.637 kg)  BMI 25.90 kg/m2   Subjective:    Patient ID: Jeffrey Logan, male    DOB: [birth year], 59 y.o.   MRN: [id number]  HPI: Jeffrey Logan is a 59 y.o. male presenting on 04/22/2015 for Annual Exam   HPI Adult well exam Patient presents today to establish care and for adult well exam. He states that he had labs done through his work in June or July and will bring those in and does not need one today.  Hypertension Patient has been on amlodipine for a few years for hypertension has been controlled and never really goes above 140 on his home checks. Today he is 120/78 and looking good. Patient denies headaches, blurred vision, chest pains, shortness of breath, or weakness. Denies any side effects from medication and is content with current medication.  GERD/Barrett's Patient has been diagnosed with GERD/Barrett's esophagus and has seen a gastroenterologist for this. And they regularly monitor him with EGDs. He is currently taking omeprazole for this and is doing well on that medication symptomatically.  Relevant past medical, surgical, family and social history reviewed and updated as indicated. Interim medical history since our last visit reviewed. Allergies and medications reviewed and updated.  Review of Systems  Constitutional: Negative for fever and chills.  HENT: Negative for congestion, ear discharge, ear pain and postnasal drip.   Eyes: Negative for discharge and visual disturbance.  Respiratory: Negative for shortness of breath and wheezing.   Cardiovascular: Negative for chest pain and leg swelling.  Gastrointestinal: Negative for nausea, abdominal pain, diarrhea, constipation, blood in stool and abdominal distention.  Genitourinary: Negative for difficulty urinating.  Musculoskeletal: Negative for back pain and gait problem.  Skin: Negative for rash.  Neurological: Negative for  dizziness, syncope, weakness, light-headedness and headaches.  Psychiatric/Behavioral: Negative for agitation.  All other systems reviewed and are negative.   Per HPI unless specifically indicated above  Social History   Social History  . Marital Status: Married    Spouse Name: N/A  . Number of Children: N/A  . Years of Education: N/A   Occupational History  . Not on file.   Social History Main Topics  . Smoking status: Former Smoker -- 1.00 packs/day for 15 years    Types: Cigarettes    Quit date: 04/15/2015  . Smokeless tobacco: Never Used  . Alcohol Use: 3.0 oz/week    4 Standard drinks or equivalent, 1 Cans of beer per week  . Drug Use: No  . Sexual Activity: Yes     Comment: married 2 years   Other Topics Concern  . Not on file   Social History Narrative    Past Surgical History  Procedure Laterality Date  . Rotator cuff repair  12/25/14    right shoulder  . Lumbar disc surgery    . Lumbar fusion      L5-S1  . Back surgery      screw placement  . Rib fracture surgery  2002    removal of left 9th rib    Family History  Problem Relation Age of Onset  . Diabetes Mother   . Heart disease Father   . Heart disease Brother       Medication List       This list is accurate as of: 04/22/15  3:14 PM.  Always use your most recent med list.  aspirin 81 MG tablet  Take 81 mg by mouth daily.     NORVASC 10 MG tablet  Generic drug:  amLODipine  Take 10 mg by mouth daily.     omeprazole 40 MG capsule  Commonly known as:  PRILOSEC  Take 40 mg by mouth daily.     testosterone 4 MG/24HR Pt24 patch  Commonly known as:  ANDRODERM  Place 1 patch onto the skin daily.     traMADol 50 MG tablet  Commonly known as:  ULTRAM  Take by mouth daily.           Objective:    BP 120/78 mmHg  Pulse 62  Temp(Src) 97.8 F (36.6 C) (Oral)  Ht 6' (1.829 m)  Wt 191 lb (86.637 kg)  BMI 25.90 kg/m2  Wt Readings from Last 3 Encounters:  04/22/15  191 lb (86.637 kg)    Physical Exam  Constitutional: He is oriented to person, place, and time. He appears well-developed and well-nourished. No distress.  HENT:  Right Ear: External ear normal.  Left Ear: External ear normal.  Nose: Nose normal.  Mouth/Throat: Oropharynx is clear and moist. No oropharyngeal exudate.  Eyes: Conjunctivae and EOM are normal. Pupils are equal, round, and reactive to light. Right eye exhibits no discharge. No scleral icterus.  Neck: Neck supple. No thyromegaly present.  Cardiovascular: Normal rate, regular rhythm, normal heart sounds and intact distal pulses.   No murmur heard. Pulmonary/Chest: Effort normal and breath sounds normal. No respiratory distress. He has no wheezes.  Abdominal: Soft. He exhibits no distension. There is no tenderness. There is no rebound and no guarding.  Musculoskeletal: Normal range of motion. He exhibits no edema.  Lymphadenopathy:    He has no cervical adenopathy.  Neurological: He is alert and oriented to person, place, and time. Coordination normal.  Skin: Skin is warm and dry. No rash noted. He is not diaphoretic.  Psychiatric: He has a normal mood and affect. His behavior is normal.  Vitals reviewed.   No results found for this or any previous visit.    Assessment & Plan:   Problem List Items Addressed This Visit      Cardiovascular and Mediastinum   Essential hypertension, benign    Controlled on amlodipine. Continue medication.      Relevant Medications   amLODipine (NORVASC) 10 MG tablet   aspirin 81 MG tablet     Digestive   GERD (gastroesophageal reflux disease)    Controlled on medication and sees gastroenterology for regular EGDs to monitor Barrett's. Continue omeprazole      Relevant Medications   omeprazole (PRILOSEC) 40 MG capsule   Barrett's esophagus    Has regular EGDs and is due for one soon to monitor this.        Other   Low testosterone    This is being managed by endocrinology  currently.       Other Visit Diagnoses    Well adult exam    -  Primary    He will bring his screening labs from June and we will look at those and determine what he needs more.        Follow up plan: Return in about 6 months (around 10/20/2015), or if symptoms worsen or fail to improve.  Arville Care, MD [facility] 04/22/2015, 3:14 PM

## 2015-04-22 NOTE — Assessment & Plan Note (Signed)
Controlled on amlodipine. Continue medication.

## 2015-04-22 NOTE — Assessment & Plan Note (Signed)
Has regular EGDs and is due for one soon to monitor this.

## 2015-04-22 NOTE — Patient Instructions (Signed)

## 2015-04-22 NOTE — Assessment & Plan Note (Signed)
Controlled on medication and sees gastroenterology for regular EGDs to monitor Barrett's. Continue omeprazole

## 2015-04-22 NOTE — Therapy (Signed)
[facility] [address] [place], [place], [address] Phone: [phone]   Fax:  [phone]  Physical Therapy Treatment  Patient Details  Name: Jeffrey Logan MRN: [id number] Date of Birth: 1956/04/24 Referring Provider:  Marcene Corning, MD  Encounter Date: 04/22/2015      PT End of Session - 04/22/15 0735    Visit Number 29   Number of Visits 36   Date for PT Re-Evaluation 05/04/15   PT Start Time 0734   PT Stop Time 0813   PT Time Calculation (min) 39 min   Activity Tolerance Patient tolerated treatment well   Behavior During Therapy [facility] for tasks assessed/performed      No past medical history on file.  No past surgical history on file.  There were no vitals filed for this visit.  Visit Diagnosis:  Right shoulder pain  Shoulder stiffness, right  Muscle weakness of right upper extremity      Subjective Assessment - 04/22/15 0750    Subjective States that he woke up with soreness and having trouble straightening R shoulder.   Limitations Lifting   Patient Stated Goals Return right shoulder to previous level of function.   Currently in Pain? Yes   Pain Score 6    Pain Location Shoulder   Pain Orientation Right   Pain Descriptors / Indicators Sore   Pain Type Surgical pain   Pain Onset More than a month ago   Pain Frequency Constant            [facility] PT Assessment - 04/22/15 0001    Assessment   Medical Diagnosis Right shoulder RCR.   Onset Date/Surgical Date 12/25/14   Next MD Visit 04/27/2015                     [facility] Adult PT Treatment/Exercise - 04/22/15 0001    Shoulder Exercises: Prone   Retraction Strengthening;Right;Weights  4x10 reps   Retraction Weight (lbs) 2   Extension Strengthening;Right;Weights  4x10 reps   Extension Weight (lbs) 2   Horizontal ABduction 1 Strengthening;Right  3x10 reps   Horizontal ABduction 1 Weight (lbs) 2   Other Prone Exercises R full can no weight 3x10  reps   Shoulder Exercises: Sidelying   External Rotation Strengthening;Right;Weights  4x10 reps   External Rotation Weight (lbs) 2   Shoulder Exercises: Standing   External Rotation Strengthening;Right;Theraband  3x10 reps   Theraband Level (Shoulder External Rotation) Level 2 (Red)   Internal Rotation Strengthening;Right;Theraband  3x10 reps   Theraband Level (Shoulder Internal Rotation) Level 2 (Red)   Flexion Strengthening;Right;Weights  4x10 reps   Shoulder Flexion Weight (lbs) 1   Other Standing Exercises RUE ball rolls x20 reps CW, x20 reps CCW   Shoulder Exercises: Pulleys   Other Pulley Exercises UE Ranger elevation/circles each x30 reps   Shoulder Exercises: ROM/Strengthening   UBE (Upper Arm Bike) 120 RPM x6 min   Iontophoresis   Type of Iontophoresis Dexamethasone   Location R ant shoulder   Dose 2 ml 5 of 6 reps   Time 8                     PT Long Term Goals - 04/15/15 1434    PT LONG TERM GOAL #1   Title Ind with a HEP.   Time 6   Period Weeks   Status On-going   PT LONG TERM GOAL #2   Title Active right shoulder flexion to 155 degrees+.  Time 6   Period Weeks   Status --  AROM 135 degrees   PT LONG TERM GOAL #3   Title Active ER to 80 degrees.   Time 6   Period Weeks   Status Achieved   PT LONG TERM GOAL #4   Title Patient reach behind back to L2 with right hand.   Time 6   Period Weeks   Status Achieved   PT LONG TERM GOAL #5   Title Right shoulder strength= 5/5.   Time 6   Period Weeks   Status On-going   PT LONG TERM GOAL #6   Title Patient able to perform ADL's with right shoulder pain not > 3/10.   Time 6   Period Weeks   Status On-going               Plan - 04/22/15 1610    Clinical Impression Statement Patient tolerated treatment fairly well although he reported soreness during the session as well as popping sensation during standing flexion. Completed all exercises as directed with minimal multimodal cueing  for proper technique of exercise. Iontophoresis patch placed over R AC joint today. Only verbalized complaints of soreness during today's treatment. Goals remain on-going secondary to ROM and strength deficiits. Expereinced 6/10 soreness following treatment today.   Pt will benefit from skilled therapeutic intervention in order to improve on the following deficits Pain;Decreased activity tolerance;Decreased range of motion;Decreased strength   Rehab Potential Excellent   Clinical Impairments Affecting Rehab Potential surgury 12/25/14 current 13 weeks 03/26/15   PT Frequency 3x / week   PT Duration 6 weeks   PT Treatment/Interventions ADLs/Self Care Home Management;Cryotherapy;Electrical Stimulation;Moist Heat;Ultrasound;Therapeutic activities;Therapeutic exercise;Neuromuscular re-education;Patient/family education;Manual techniques;Passive range of motion;Iontophoresis /ml Dexamethasone   PT Next Visit Plan continue with POC( MD 04/27/15)   Consulted and Agree with Plan of Care Patient        Problem List There are no active problems to display for this patient.   Evelene Croon, PTA 04/22/2015, 9:01 AM  [facility] [address] [place], [place], [address] Phone: [phone]   Fax:  [phone]

## 2015-04-24 ENCOUNTER — Encounter: Payer: Self-pay | Admitting: *Deleted

## 2015-04-24 ENCOUNTER — Ambulatory Visit: Payer: Worker's Compensation | Admitting: *Deleted

## 2015-04-24 DIAGNOSIS — M25611 Stiffness of right shoulder, not elsewhere classified: Secondary | ICD-10-CM

## 2015-04-24 DIAGNOSIS — M25511 Pain in right shoulder: Secondary | ICD-10-CM | POA: Diagnosis not present

## 2015-04-24 DIAGNOSIS — M6281 Muscle weakness (generalized): Secondary | ICD-10-CM

## 2015-04-24 NOTE — Therapy (Signed)
[facility] [place], [place], [address] Phone: [phone]   Fax:  [phone]  Physical Therapy Treatment  Patient Details  Name: Jeffrey Logan MRN: [id number] Date of Birth: 31-Aug-1955 Referring Provider:  Melrose Nakayama, MD  Encounter Date: 04/24/2015      PT End of Session - 04/24/15 0941    Visit Number 30   Number of Visits 36   Date for PT Re-Evaluation 05/04/15   PT Start Time 0900   PT Stop Time 0949   PT Time Calculation (min) 49 min      Past Medical History  Diagnosis Date  . Hypertension   . GERD (gastroesophageal reflux disease)   . Sleep apnea 2012    wears a cpap at night    Past Surgical History  Procedure Laterality Date  . Rotator cuff repair  12/25/14    right shoulder  . Lumbar disc surgery    . Lumbar fusion      L5-S1  . Back surgery      screw placement  . Rib fracture surgery  2002    removal of left 9th rib    There were no vitals filed for this visit.  Visit Diagnosis:  Right shoulder pain  Shoulder stiffness, right  Muscle weakness of right upper extremity      Subjective Assessment - 04/24/15 0906    Subjective RT shldr still pops. Going to MD on Monday   Limitations Lifting   Patient Stated Goals Return right shoulder to previous level of function.   Currently in Pain? Yes   Pain Score 5    Pain Location Shoulder   Pain Orientation Right   Pain Descriptors / Indicators Sore   Pain Type Surgical pain   Pain Onset More than a month ago   Pain Frequency Constant   Aggravating Factors  raising arm to outside   Pain Relieving Factors rest            [facility] PT Assessment - 04/24/15 0001    AROM   Overall AROM  Deficits   Overall AROM Comments  flexion 156 , ABD 150  er 62   IR HBB L2                                                                                                       ALL MOTIONS ARE IN STANDING                [facility] Adult PT  Treatment/Exercise - 04/24/15 0001    Exercises   Exercises Shoulder   Shoulder Exercises: Supine   Other Supine Exercises 2# ball circles each way 3x10 supine   Shoulder Exercises: Prone   Retraction Strengthening;Right;Weights  4x10 reps ROWING   Retraction Weight (lbs) 3   Extension Strengthening;Right;Weights  4x10 reps   Extension Weight (lbs) 3   Horizontal ABduction 1 Strengthening;Right  3x10 reps   Horizontal ABduction 1 Weight (lbs) 2   Other Prone Exercises R full can 1# weight 3x10 reps   Shoulder Exercises: Sidelying   External  Rotation Strengthening;Right;Weights  4x10 reps   External Rotation Weight (lbs) 2   Shoulder Exercises: Standing   External Rotation Strengthening;Right;Theraband  3x10 reps   Theraband Level (Shoulder External Rotation) Level 2 (Red)   Internal Rotation Strengthening;Right;Theraband  3x10 reps   Theraband Level (Shoulder Internal Rotation) Level 2 (Red)   Flexion Strengthening;Right;Weights  4x10 reps   Shoulder Flexion Weight (lbs) 2   ABduction Strengthening;Right;Weights  4x10   Shoulder ABduction Weight (lbs) 1   Other Standing Exercises RUE ball rolls x20 reps CW, x20 reps CCW   Shoulder Exercises: Pulleys   Other Pulley Exercises UE Ranger elevation/circles each x30 reps   Shoulder Exercises: ROM/Strengthening   UBE (Upper Arm Bike) 120 RPM x6 min   Iontophoresis   Type of Iontophoresis Dexamethasone   Location R ant shoulder   Dose 2 ml 5 of 6 reps   Time 8   Manual Therapy   Manual Therapy --   Manual therapy comments --                     PT Long Term Goals - 04/24/15 0941    PT LONG TERM GOAL #1   Title Ind with a HEP.   Period Weeks   Status On-going   PT LONG TERM GOAL #2   Title Active right shoulder flexion to 155 degrees+.   Time 6   Period Weeks   Status Achieved   PT LONG TERM GOAL #3   Title Active ER to 80 degrees.   Time 6   Period Weeks   Status Not Met   PT LONG TERM GOAL #4    Title Patient reach behind back to L2 with right hand.   Time 6   Period Weeks   Status Achieved   PT LONG TERM GOAL #5   Title Right shoulder strength= 5/5.   Time 6   Period Weeks   Status On-going   PT LONG TERM GOAL #6   Title Patient able to perform ADL's with right shoulder pain not > 3/10.   Time 6   Period Weeks   Status On-going               Plan - 04/24/15 0630    Clinical Impression Statement Pt continues to progress with PREs and ROM. His chief complaint has been popping and pain in RT shldr with reaching up and across his body. He is still tender along distal acromion  and ACJ.   Pt will benefit from skilled therapeutic intervention in order to improve on the following deficits Pain;Decreased activity tolerance;Decreased range of motion;Decreased strength   Clinical Impairments Affecting Rehab Potential surgury 12/25/14 current 17 weeks 04/24/15   PT Frequency 3x / week   PT Duration 6 weeks   PT Treatment/Interventions ADLs/Self Care Home Management;Cryotherapy;Electrical Stimulation;Moist Heat;Ultrasound;Therapeutic activities;Therapeutic exercise;Neuromuscular re-education;Patient/family education;Manual techniques;Passive range of motion;Iontophoresis 35m/ml Dexamethasone   PT Next Visit Plan continue with POC( MD 04/27/15) We will continue as per MD   Consulted and Agree with Plan of Care Patient        Problem List Patient Active Problem List   Diagnosis Date Noted  . Low testosterone 04/22/2015  . GERD (gastroesophageal reflux disease) 04/22/2015  . Barrett's esophagus 04/22/2015  . Essential hypertension, benign 04/22/2015    APPLEGATE, CMali PTA 04/24/2015, 12:05 PM  CMaliApplegate [facility] [address] [place] [address]Phone: [phone]  Fax:  [phone]

## 2015-04-27 ENCOUNTER — Ambulatory Visit: Payer: Worker's Compensation | Admitting: Physical Therapy

## 2015-04-27 ENCOUNTER — Encounter: Payer: Self-pay | Admitting: Physical Therapy

## 2015-04-27 DIAGNOSIS — M25511 Pain in right shoulder: Secondary | ICD-10-CM

## 2015-04-27 DIAGNOSIS — M25611 Stiffness of right shoulder, not elsewhere classified: Secondary | ICD-10-CM

## 2015-04-27 DIAGNOSIS — M6281 Muscle weakness (generalized): Secondary | ICD-10-CM

## 2015-04-27 NOTE — Therapy (Signed)
[facility] [place], [place], [address] Phone: [phone]   Fax:  [phone]  Physical Therapy Treatment  Patient Details  Name: Jeffrey Logan MRN: [id number] Date of Birth: 10-10-1955 Referring Provider:  Dettinger, Fransisca Kaufmann, MD  Encounter Date: 04/27/2015      PT End of Session - 04/27/15 0813    Visit Number 31   Number of Visits 36   Date for PT Re-Evaluation 05/04/15   PT Start Time 0729   PT Stop Time 0813   PT Time Calculation (min) 44 min   Activity Tolerance Patient tolerated treatment well   Behavior During Therapy [facility] for tasks assessed/performed      Past Medical History  Diagnosis Date  . Hypertension   . GERD (gastroesophageal reflux disease)   . Sleep apnea 2012    wears a cpap at night    Past Surgical History  Procedure Laterality Date  . Rotator cuff repair  12/25/14    right shoulder  . Lumbar disc surgery    . Lumbar fusion      L5-S1  . Back surgery      screw placement  . Rib fracture surgery  2002    removal of left 9th rib    There were no vitals filed for this visit.  Visit Diagnosis:  Right shoulder pain  Shoulder stiffness, right  Muscle weakness of right upper extremity      Subjective Assessment - 04/27/15 0732    Subjective RT shldr still pops. Going to MD today at 10AM   Limitations Lifting   Patient Stated Goals Return right shoulder to previous level of function.   Currently in Pain? Yes   Pain Score 4    Pain Location Shoulder   Pain Orientation Right   Pain Descriptors / Indicators Sore   Pain Type Surgical pain   Pain Onset More than a month ago   Pain Frequency Constant   Aggravating Factors  raising arm   Pain Relieving Factors rest            [facility] PT Assessment - 04/27/15 0001    AROM   Overall AROM  Within functional limits for tasks performed   Overall AROM Comments AROM 86 degrees ER                     [facility] Adult PT  Treatment/Exercise - 04/27/15 0001    Shoulder Exercises: Prone   Retraction Strengthening;Right;Weights  3x10   Retraction Weight (lbs) 3   Extension Strengthening;Right;Weights  3x10   Extension Weight (lbs) 3   Horizontal ABduction 1 Strengthening;Right  3x10   Horizontal ABduction 1 Weight (lbs) 2   Shoulder Exercises: Sidelying   External Rotation Strengthening;Right;Weights  3x10   External Rotation Weight (lbs) 2   Shoulder Exercises: Standing   External Rotation Strengthening;Right;Theraband  3x10   Theraband Level (Shoulder External Rotation) Level 1 (Yellow)   Internal Rotation Strengthening;Right;Theraband  3x10   Theraband Level (Shoulder Internal Rotation) Level 1 (Yellow)   Flexion Strengthening;Right;Weights  3x10   Shoulder Flexion Weight (lbs) 2   Extension Strengthening;Right;Theraband  3x10   Theraband Level (Shoulder Extension) Level 1 (Yellow)   Row Strengthening;Right;Theraband  3x10   Theraband Level (Shoulder Row) Level 1 (Yellow)   Other Standing Exercises wall circles each x fatigue   Other Standing Exercises wall pushups   Shoulder Exercises: Pulleys   Other Pulley Exercises UE Ranger elevation/circles each x30 reps   Shoulder Exercises:  ROM/Strengthening   UBE (Upper Arm Bike) 120 RPM x6 min                PT Education - 04/27/15 0811    Education provided Yes   Education Details HEP   Person(s) Educated Patient   Methods Explanation;Demonstration;Handout   Comprehension Verbalized understanding;Returned demonstration             PT Long Term Goals - 04/27/15 0747    PT LONG TERM GOAL #1   Title Ind with a HEP.   Time 6   Period Weeks   Status Achieved   PT LONG TERM GOAL #2   Title Active right shoulder flexion to 155 degrees+.   Time 6   Period Weeks   Status Achieved   PT LONG TERM GOAL #3   Title Active ER to 80 degrees.   Time 6   Period Weeks   Status Achieved  86   PT LONG TERM GOAL #4   Title Patient  reach behind back to L2 with right hand.   Time 6   Period Weeks   Status Achieved   PT LONG TERM GOAL #5   Title Right shoulder strength= 5/5.   Time 6   Period Weeks   Status On-going   PT LONG TERM GOAL #6   Title Patient able to perform ADL's with right shoulder pain not > 3/10.   Time 6   Period Weeks   Status On-going  5/10               Plan - 04/27/15 0813    Clinical Impression Statement patient continues to progress with all activities. Patient progressing with strengthening with no difficulty, only some uncomfortable popping in shoulder with overhead movements. Patient has improved ROM and is able to progress with all HEP exercises. Patient met LTG #1 and #3 today.  Other goals ongoing due to pain with ADL's and strength deficits.   Pt will benefit from skilled therapeutic intervention in order to improve on the following deficits Pain;Decreased activity tolerance;Decreased range of motion;Decreased strength   Rehab Potential Excellent   Clinical Impairments Affecting Rehab Potential surgury 12/25/14 current 17 weeks 04/24/15   PT Frequency 3x / week   PT Duration 6 weeks   PT Treatment/Interventions ADLs/Self Care Home Management;Cryotherapy;Electrical Stimulation;Moist Heat;Ultrasound;Therapeutic activities;Therapeutic exercise;Neuromuscular re-education;Patient/family education;Manual techniques;Passive range of motion;Iontophoresis 84m/ml Dexamethasone   PT Next Visit Plan cont per MD. DRhona Raider  Consulted and Agree with Plan of Care Patient        Problem List Patient Active Problem List   Diagnosis Date Noted  . Low testosterone 04/22/2015  . GERD (gastroesophageal reflux disease) 04/22/2015  . Barrett's esophagus 04/22/2015  . Essential hypertension, benign 04/22/2015    APPLEGATE, CMali PTA 04/27/2015, 11:32 AM  CMaliApplegate [facility]PT [facility] [address]Phone: [phone]   Fax:  [phone]

## 2015-04-27 NOTE — Patient Instructions (Signed)
Progressive Resisted: External Rotation (Side-Lying)   Holding __2__ pound weight, towel under arm, raise right forearm toward ceiling. Keep elbow bent and at side. Repeat _10___ times per set. Do __3__ sets per session. Do __1-2__ sessions per day.  http://orth.exer.us/878   Copyright  VHI. All rights reserved.  Progressive Resisted: Extension (Prone)   Holding __2__ pound weights, arms back, raise arms from floor, keeping elbows straight. Repeat _10___ times per set. Do __3__ sets per session. Do _1-2___ sessions per day.  http://orth.exer.us/872   Copyright  VHI. All rights reserved.

## 2015-04-29 ENCOUNTER — Ambulatory Visit: Payer: Worker's Compensation | Attending: Orthopaedic Surgery | Admitting: Physical Therapy

## 2015-04-29 ENCOUNTER — Encounter: Payer: Self-pay | Admitting: Physical Therapy

## 2015-04-29 DIAGNOSIS — M25511 Pain in right shoulder: Secondary | ICD-10-CM | POA: Diagnosis present

## 2015-04-29 DIAGNOSIS — M6281 Muscle weakness (generalized): Secondary | ICD-10-CM | POA: Insufficient documentation

## 2015-04-29 DIAGNOSIS — M25611 Stiffness of right shoulder, not elsewhere classified: Secondary | ICD-10-CM | POA: Insufficient documentation

## 2015-04-29 NOTE — Therapy (Signed)
[facility] [address] [place], [place], [address] Phone: [phone]   Fax:  [phone]  Physical Therapy Treatment  Patient Details  Name: Jeffrey Logan MRN: [id number] Date of Birth: 1956/02/05 Referring Provider:  Dettinger, Elige Radon, MD  Encounter Date: 04/29/2015      PT End of Session - 04/29/15 0748    Visit Number 32   Number of Visits 36   Date for PT Re-Evaluation 05/04/15   PT Start Time 0728   PT Stop Time 0812   PT Time Calculation (min) 44 min   Activity Tolerance Patient tolerated treatment well   Behavior During Therapy [facility] for tasks assessed/performed      Past Medical History  Diagnosis Date  . Hypertension   . GERD (gastroesophageal reflux disease)   . Sleep apnea 2012    wears a cpap at night    Past Surgical History  Procedure Laterality Date  . Rotator cuff repair  12/25/14    right shoulder  . Lumbar disc surgery    . Lumbar fusion      L5-S1  . Back surgery      screw placement  . Rib fracture surgery  2002    removal of left 9th rib    There were no vitals filed for this visit.  Visit Diagnosis:  Right shoulder pain  Shoulder stiffness, right  Muscle weakness of right upper extremity      Subjective Assessment - 04/29/15 0738    Subjective Patient reported to continue therapy and did not bring order but will bring next trearment   Limitations Lifting   Patient Stated Goals Return right shoulder to previous level of function.   Currently in Pain? Yes   Pain Score 4    Pain Location Shoulder   Pain Orientation Right   Pain Descriptors / Indicators Sore   Pain Type Surgical pain   Pain Onset More than a month ago   Pain Frequency Constant   Aggravating Factors  raising arm   Pain Relieving Factors rest                         [facility] Adult PT Treatment/Exercise - 04/29/15 0001    Shoulder Exercises: Prone   Retraction Strengthening;Right;Weights  3x10   Retraction Weight (lbs) 3   Extension Strengthening;Right;Weights  3x10   Extension Weight (lbs) 3   Horizontal ABduction 1 Strengthening;Right  3x10   Horizontal ABduction 1 Weight (lbs) 2   Shoulder Exercises: Sidelying   External Rotation Strengthening;Right;Weights  3x10   External Rotation Weight (lbs) 3   Shoulder Exercises: Standing   External Rotation Strengthening;Right;Theraband  3x10   Theraband Level (Shoulder External Rotation) Level 1 (Yellow)   Internal Rotation Strengthening;Right;Theraband  3x10   Theraband Level (Shoulder Internal Rotation) Level 1 (Yellow)   Flexion Strengthening;Right;Weights   Shoulder Flexion Weight (lbs) 2   ABduction Strengthening;Right;Weights  3x10 in scaption   Shoulder ABduction Weight (lbs) 2   Extension Strengthening;Right;Theraband  3x10   Theraband Level (Shoulder Extension) Level 1 (Yellow)   Row Strengthening;Right;Theraband  3x10   Theraband Level (Shoulder Row) Level 1 (Yellow)   Retraction Strengthening;Both  3x10 with pink XTS   Other Standing Exercises wall circles each x fatigue   Other Standing Exercises wall pushups 2x10   Shoulder Exercises: Pulleys   Other Pulley Exercises UE Ranger elevation/circles each x30 reps   Shoulder Exercises: ROM/Strengthening   UBE (Upper Arm Bike) 90 RPM x6  min                     PT Long Term Goals - 04/27/15 0747    PT LONG TERM GOAL #1   Title Ind with a HEP.   Time 6   Period Weeks   Status Achieved   PT LONG TERM GOAL #2   Title Active right shoulder flexion to 155 degrees+.   Time 6   Period Weeks   Status Achieved   PT LONG TERM GOAL #3   Title Active ER to 80 degrees.   Time 6   Period Weeks   Status Achieved  86   PT LONG TERM GOAL #4   Title Patient reach behind back to L2 with right hand.   Time 6   Period Weeks   Status Achieved   PT LONG TERM GOAL #5   Title Right shoulder strength= 5/5.   Time 6   Period Weeks   Status On-going   PT  LONG TERM GOAL #6   Title Patient able to perform ADL's with right shoulder pain not > 3/10.   Time 6   Period Weeks   Status On-going  5/10               Plan - 04/29/15 0749    Clinical Impression Statement Patient is to cont therapy per MD and will bring new order next visit. Patient repoted the doctor said all popping is normal and shoulder looks good. Patient has no reports of increased pain with strengthening activites. Strength goal ongoing due to pain deficits.    Pt will benefit from skilled therapeutic intervention in order to improve on the following deficits Pain;Decreased activity tolerance;Decreased range of motion;Decreased strength   Rehab Potential Excellent   Clinical Impairments Affecting Rehab Potential surgury 12/25/14 current 17 weeks 04/24/15   PT Frequency 3x / week   PT Duration 6 weeks   PT Treatment/Interventions ADLs/Self Care Home Management;Cryotherapy;Electrical Stimulation;Moist Heat;Ultrasound;Therapeutic activities;Therapeutic exercise;Neuromuscular re-education;Patient/family education;Manual techniques;Passive range of motion;Iontophoresis /ml Dexamethasone   PT Next Visit Plan cont with strengthening   Consulted and Agree with Plan of Care Patient        Problem List Patient Active Problem List   Diagnosis Date Noted  . Low testosterone 04/22/2015  . GERD (gastroesophageal reflux disease) 04/22/2015  . Barrett's esophagus 04/22/2015  . Essential hypertension, benign 04/22/2015    [name], PTA 04/29/2015, 8:12 AM  [facility] [address] [place], [place], [address] Phone: [phone]   Fax:  [phone]

## 2015-05-01 ENCOUNTER — Ambulatory Visit: Payer: Worker's Compensation | Admitting: Physical Therapy

## 2015-05-01 DIAGNOSIS — M25611 Stiffness of right shoulder, not elsewhere classified: Secondary | ICD-10-CM

## 2015-05-01 DIAGNOSIS — M6281 Muscle weakness (generalized): Secondary | ICD-10-CM

## 2015-05-01 DIAGNOSIS — M25511 Pain in right shoulder: Secondary | ICD-10-CM | POA: Diagnosis not present

## 2015-05-01 NOTE — Therapy (Signed)
[facility] [address] [place], [place], [address] Phone: [phone]   Fax:  [phone]  Physical Therapy Treatment  Patient Details  Name: Jeffrey Logan MRN: [id number] Date of Birth: [birth year] Referring Provider:  Dettinger, Elige Radon, MD  Encounter Date: 05/01/2015      PT End of Session - 05/01/15 1201    Visit Number 33   Number of Visits 36   Date for PT Re-Evaluation 05/04/15   PT Start Time 0948   PT Stop Time 1033   PT Time Calculation (min) 45 min   Activity Tolerance Patient tolerated treatment well   Behavior During Therapy [facility] for tasks assessed/performed      Past Medical History  Diagnosis Date  . Hypertension   . GERD (gastroesophageal reflux disease)   . Sleep apnea 2012    wears a cpap at night    Past Surgical History  Procedure Laterality Date  . Rotator cuff repair  12/25/14    right shoulder  . Lumbar disc surgery    . Lumbar fusion      L5-S1  . Back surgery      screw placement  . Rib fracture surgery  2002    removal of left 9th rib    There were no vitals filed for this visit.  Visit Diagnosis:  Right shoulder pain  Shoulder stiffness, right  Muscle weakness of right upper extremity      Subjective Assessment - 05/01/15 1147    Subjective Pain around a 4/10.   Pain Score 4    Pain Location Shoulder   Pain Descriptors / Indicators Sore   Pain Type Surgical pain   Pain Onset More than a month ago   Pain Frequency Constant                                      PT Long Term Goals - 04/27/15 0747    PT LONG TERM GOAL #1   Title Ind with a HEP.   Time 6   Period Weeks   Status Achieved   PT LONG TERM GOAL #2   Title Active right shoulder flexion to 155 degrees+.   Time 6   Period Weeks   Status Achieved   PT LONG TERM GOAL #3   Title Active ER to 80 degrees.   Time 6   Period Weeks   Status Achieved  86   PT LONG TERM GOAL #4   Title  Patient reach behind back to L2 with right hand.   Time 6   Period Weeks   Status Achieved   PT LONG TERM GOAL #5   Title Right shoulder strength= 5/5.   Time 6   Period Weeks   Status On-going   PT LONG TERM GOAL #6   Title Patient able to perform ADL's with right shoulder pain not > 3/10.   Time 6   Period Weeks   Status On-going  5/10     Treatment:  UBE @ 90 RPM's x 10 minutes f/b UE Ranger on wall x 5 minutes.  1# full can 4 x 10 reps; 2# shoulder flexion to 80 degrees with palm down 4 x 10 reps; 2# prone rows 4 x 10 reps; 2# prone horizontal abduction 4 x 10 reps; 0# prone horizontal abduction @ 105 degrees of abduction with thumb up 3 sets of 10 reps; [facility] ER 3#  4 x 10 reps.  Excellent job.            Problem List Patient Active Problem List   Diagnosis Date Noted  . Low testosterone 04/22/2015  . GERD (gastroesophageal reflux disease) 04/22/2015  . Barrett's esophagus 04/22/2015  . Essential hypertension, benign 04/22/2015    APP[place], [place] MPT 05/01/2015, 12:03 PM  [facility] [address] [place], [place], [address] Phone: [phone]   Fax:  [phone]

## 2015-05-04 ENCOUNTER — Encounter: Payer: Self-pay | Admitting: Physical Therapy

## 2015-05-04 ENCOUNTER — Ambulatory Visit: Payer: Worker's Compensation | Attending: Orthopaedic Surgery | Admitting: Physical Therapy

## 2015-05-04 DIAGNOSIS — M25611 Stiffness of right shoulder, not elsewhere classified: Secondary | ICD-10-CM

## 2015-05-04 DIAGNOSIS — M25511 Pain in right shoulder: Secondary | ICD-10-CM | POA: Diagnosis not present

## 2015-05-04 DIAGNOSIS — M6281 Muscle weakness (generalized): Secondary | ICD-10-CM | POA: Diagnosis present

## 2015-05-04 NOTE — Therapy (Signed)
[facility] [place], [place], [address] Phone: [phone]   Fax:  [phone]  Physical Therapy Treatment  Patient Details  Name: Jeffrey Logan MRN: [id number] Date of Birth: 04-27-1956 Referring Provider:  Dettinger, Fransisca Kaufmann, MD  Encounter Date: 05/04/2015      PT End of Session - 05/04/15 0757    Visit Number 34   Number of Visits 36   Date for PT Re-Evaluation 05/04/15   PT Start Time 0729   PT Stop Time 0813   PT Time Calculation (min) 44 min   Activity Tolerance Patient tolerated treatment well   Behavior During Therapy [facility] for tasks assessed/performed      Past Medical History  Diagnosis Date  . Hypertension   . GERD (gastroesophageal reflux disease)   . Sleep apnea 2012    wears a cpap at night    Past Surgical History  Procedure Laterality Date  . Rotator cuff repair  12/25/14    right shoulder  . Lumbar disc surgery    . Lumbar fusion      L5-S1  . Back surgery      screw placement  . Rib fracture surgery  2002    removal of left 9th rib    There were no vitals filed for this visit.  Visit Diagnosis:  Right shoulder pain  Shoulder stiffness, right  Muscle weakness of right upper extremity      Subjective Assessment - 05/04/15 0738    Subjective patient has not recieved a N.O. yet will call for one to continue   Limitations Lifting   Patient Stated Goals Return right shoulder to previous level of function.   Currently in Pain? Yes   Pain Score 2    Pain Location Shoulder   Pain Orientation Right   Pain Descriptors / Indicators Throbbing   Pain Type Surgical pain   Pain Onset More than a month ago   Pain Frequency Constant   Aggravating Factors  raising arm   Pain Relieving Factors rest            [facility] PT Assessment - 05/04/15 0001    ROM / Strength   AROM / PROM / Strength Strength   Strength   Overall Strength Deficits   Strength Assessment Site Shoulder   Right/Left  Shoulder Right   Right Shoulder Flexion 4+/5   Right Shoulder ABduction 4/5   Right Shoulder Internal Rotation 4+/5   Right Shoulder External Rotation 4/5                     [facility] Adult PT Treatment/Exercise - 05/04/15 0001    Shoulder Exercises: Prone   Retraction Strengthening;Right;Weights  3x10   Retraction Weight (lbs) 3   Extension Strengthening;Right;Weights  3x10   Extension Weight (lbs) 3   Horizontal ABduction 1 Strengthening;Right  3x10   Horizontal ABduction 1 Weight (lbs) 2   Shoulder Exercises: Sidelying   External Rotation Strengthening;Right;Weights  3x10   External Rotation Weight (lbs) 3   Shoulder Exercises: Standing   External Rotation Strengthening;Right;Theraband  3x10   Theraband Level (Shoulder External Rotation) Level 1 (Yellow)   Internal Rotation Strengthening;Right;Theraband  3x10   Theraband Level (Shoulder Internal Rotation) Level 1 (Yellow)   Flexion Strengthening;Right;Weights  3x10   Shoulder Flexion Weight (lbs) 2   ABduction Strengthening;Right;Weights  3x10 in scaption   Shoulder ABduction Weight (lbs) 2   Extension Strengthening;Right;Theraband  3x10   Theraband Level (Shoulder Extension) Level 2 (  Red)   Row Strengthening;Right;Theraband  3x10   Theraband Level (Shoulder Row) Level 2 (Red)   Retraction Strengthening;Both  3x10   Other Standing Exercises wall circles each x fatigue   Other Standing Exercises wall pushups 2x10   Shoulder Exercises: Pulleys   Other Pulley Exercises UE Ranger elevation/circles each x30 reps   Shoulder Exercises: ROM/Strengthening   UBE (Upper Arm Bike) 90 RPM x8 min                     PT Long Term Goals - 05/04/15 0800    PT LONG TERM GOAL #1   Title Ind with a HEP.   Time 6   Period Weeks   Status Achieved   PT LONG TERM GOAL #2   Title Active right shoulder flexion to 155 degrees+.   Time 6   Period Weeks   Status Achieved   PT LONG TERM GOAL #3   Title  Active ER to 80 degrees.   Time 6   Period Weeks   Status Achieved   PT LONG TERM GOAL #4   Title Patient reach behind back to L2 with right hand.   Time 6   Period Weeks   Status Achieved   PT LONG TERM GOAL #5   Title Right shoulder strength= 5/5.   Time 6   Period Weeks   Status On-going   PT LONG TERM GOAL #6   Title Patient able to perform ADL's with right shoulder pain not > 3/10.   Time 6   Period Weeks   Status Achieved  3/10               Plan - 05/04/15 0758    Clinical Impression Statement Patient continues to progress with all strength activities. Patient has no pain increase with exercises and has improved right shoulder strength yet goal ongoing due to strength deficits. Patient has met LTG #6 today and reported able to perform ADL's with pain 3/10 at most.   Pt will benefit from skilled therapeutic intervention in order to improve on the following deficits Pain;Decreased activity tolerance;Decreased range of motion;Decreased strength   Rehab Potential Excellent   Clinical Impairments Affecting Rehab Potential surgury 12/25/14 current 18 weeks 05/01/15   PT Frequency 3x / week   PT Duration 6 weeks   PT Treatment/Interventions ADLs/Self Care Home Management;Cryotherapy;Electrical Stimulation;Moist Heat;Ultrasound;Therapeutic activities;Therapeutic exercise;Neuromuscular re-education;Patient/family education;Manual techniques;Passive range of motion;Iontophoresis 30m/ml Dexamethasone   PT Next Visit Plan cont with strengthening   Consulted and Agree with Plan of Care Patient        Problem List Patient Active Problem List   Diagnosis Date Noted  . Low testosterone 04/22/2015  . GERD (gastroesophageal reflux disease) 04/22/2015  . Barrett's esophagus 04/22/2015  . Essential hypertension, benign 04/22/2015    DPhillips Climes PTA 05/04/2015, 8:14 AM  [facility]  [address]: [phone]  Fax:  [phone]

## 2015-05-06 ENCOUNTER — Encounter: Payer: Self-pay | Admitting: Physical Therapy

## 2015-05-06 ENCOUNTER — Ambulatory Visit: Payer: Worker's Compensation | Admitting: Physical Therapy

## 2015-05-06 DIAGNOSIS — M25511 Pain in right shoulder: Secondary | ICD-10-CM | POA: Diagnosis not present

## 2015-05-06 DIAGNOSIS — M6281 Muscle weakness (generalized): Secondary | ICD-10-CM

## 2015-05-06 DIAGNOSIS — M25611 Stiffness of right shoulder, not elsewhere classified: Secondary | ICD-10-CM

## 2015-05-06 NOTE — Therapy (Addendum)
[facility] [address] [place], [place], [address] Phone: [phone]   Fax:  [phone]  Physical Therapy Treatment  Patient Details  Name: Jeffrey Logan MRN: [id number] Date of Birth: [birth year] Referring Provider:  Dettinger, Elige Radon, MD  Encounter Date: 05/06/2015      PT End of Session - 05/06/15 0802    Visit Number 35   Number of Visits 36   Date for PT Re-Evaluation 05/04/15   PT Start Time 0730   PT Stop Time 0815   PT Time Calculation (min) 45 min   Activity Tolerance Patient tolerated treatment well   Behavior During Therapy [facility] for tasks assessed/performed      Past Medical History  Diagnosis Date  . Hypertension   . GERD (gastroesophageal reflux disease)   . Sleep apnea 2012    wears a cpap at night    Past Surgical History  Procedure Laterality Date  . Rotator cuff repair  12/25/14    right shoulder  . Lumbar disc surgery    . Lumbar fusion      L5-S1  . Back surgery      screw placement  . Rib fracture surgery  2002    removal of left 9th rib    There were no vitals filed for this visit.  Visit Diagnosis:  Right shoulder pain  Shoulder stiffness, right  Muscle weakness of right upper extremity      Subjective Assessment - 05/06/15 0738    Subjective awaiting order to continue/patient progresing well with no new complaints   Limitations Lifting   Patient Stated Goals Return right shoulder to previous level of function.   Currently in Pain? Yes   Pain Score 2    Pain Location Shoulder   Pain Orientation Right   Pain Descriptors / Indicators Throbbing   Pain Type Surgical pain   Pain Onset More than a month ago   Pain Frequency Constant   Aggravating Factors  raising arm overhead   Pain Relieving Factors rest                         [facility] Adult PT Treatment/Exercise - 05/06/15 0001    Shoulder Exercises: Prone   Retraction Strengthening;Right;Weights  3x10   Retraction Weight (lbs) 3   Extension Strengthening;Right;Weights  3x10   Extension Weight (lbs) 3   Horizontal ABduction 1 Strengthening;Right  3x10   Horizontal ABduction 1 Weight (lbs) 2   Shoulder Exercises: Sidelying   External Rotation Strengthening;Right;Weights  3x10   External Rotation Weight (lbs) 3   Shoulder Exercises: Standing   External Rotation Strengthening;Right;Theraband  3x10   Theraband Level (Shoulder External Rotation) Level 1 (Yellow)   Internal Rotation Strengthening;Right;Theraband  3x10   Theraband Level (Shoulder Internal Rotation) Level 1 (Yellow)   Flexion Strengthening;Right;Weights  3x10   Shoulder Flexion Weight (lbs) 2   ABduction Strengthening;Right;Weights  3x10 in scaption   Shoulder ABduction Weight (lbs) 2   Extension Strengthening;Right;Theraband  3x10   Theraband Level (Shoulder Extension) Level 2 (Red)   Row Strengthening;Right;Theraband  3x10   Theraband Level (Shoulder Row) Level 2 (Red)   Retraction Strengthening;Both  pink XTS 3x10   Other Standing Exercises wall circles each x fatigue   Other Standing Exercises wall pushups 2x10   Shoulder Exercises: Pulleys   Other Pulley Exercises UE Ranger elevation/circles each x30 reps   Shoulder Exercises: ROM/Strengthening   UBE (Upper Arm Bike) 90 RPM x8 min  PT Long Term Goals - 05/04/15 0800    PT LONG TERM GOAL #1   Title Ind with a HEP.   Time 6   Period Weeks   Status Achieved   PT LONG TERM GOAL #2   Title Active right shoulder flexion to 155 degrees+.   Time 6   Period Weeks   Status Achieved   PT LONG TERM GOAL #3   Title Active ER to 80 degrees.   Time 6   Period Weeks   Status Achieved   PT LONG TERM GOAL #4   Title Patient reach behind back to L2 with right hand.   Time 6   Period Weeks   Status Achieved   PT LONG TERM GOAL #5   Title Right shoulder strength= 5/5.   Time 6   Period Weeks   Status On-going   PT LONG TERM  GOAL #6   Title Patient able to perform ADL's with right shoulder pain not > 3/10.   Time 6   Period Weeks   Status Achieved  3/10               Plan - 05/06/15 0804    Clinical Impression Statement Patient continues to progress with all activities and strength exercises. Patient has no pain increase or complaints other than same occasional popping with overhead movement which MD said it was normal. Patient strength goal ongoing due to strength deficits in right shoulder.   Pt will benefit from skilled therapeutic intervention in order to improve on the following deficits Pain;Decreased activity tolerance;Decreased range of motion;Decreased strength   Rehab Potential Excellent   Clinical Impairments Affecting Rehab Potential surgury 12/25/14 current 19 weeks 05/08/15   PT Frequency 3x / week   PT Duration 6 weeks   PT Treatment/Interventions ADLs/Self Care Home Management;Cryotherapy;Electrical Stimulation;Moist Heat;Ultrasound;Therapeutic activities;Therapeutic exercise;Neuromuscular re-education;Patient/family education;Manual techniques;Passive range of motion;Iontophoresis /ml Dexamethasone   PT Next Visit Plan cont with strengthening and disscuss with MPT how to progress (follow up was sent 05/06/15)   Consulted and Agree with Plan of Care Patient        Problem List Patient Active Problem List   Diagnosis Date Noted  . Low testosterone 04/22/2015  . GERD (gastroesophageal reflux disease) 04/22/2015  . Barrett's esophagus 04/22/2015  . Essential hypertension, benign 04/22/2015    Bibi Economos P, PTA 05/06/2015, 8:17 AM  [facility] [address] [place], [place], [address] Phone: [phone]   Fax:  [phone]

## 2015-05-08 ENCOUNTER — Ambulatory Visit: Payer: Worker's Compensation | Admitting: Physical Therapy

## 2015-05-08 ENCOUNTER — Encounter: Payer: Self-pay | Admitting: Physical Therapy

## 2015-05-08 DIAGNOSIS — M25511 Pain in right shoulder: Secondary | ICD-10-CM | POA: Diagnosis not present

## 2015-05-08 DIAGNOSIS — M6281 Muscle weakness (generalized): Secondary | ICD-10-CM

## 2015-05-08 DIAGNOSIS — M25611 Stiffness of right shoulder, not elsewhere classified: Secondary | ICD-10-CM

## 2015-05-08 NOTE — Therapy (Signed)
[facility] [address] [place], [place], [address] Phone: [phone]   Fax:  [phone]  Physical Therapy Treatment  Patient Details  Name: Jeffrey Logan MRN: [id number] Date of Birth: [birth year] Referring Provider:  Dettinger, Elige Radon, MD  Encounter Date: 05/08/2015      PT End of Session - 05/08/15 0733    Visit Number 36   Number of Visits 48  NO received from MD for 12 visits wtih date of 06/02/2015   Date for PT Re-Evaluation 06/02/15   PT Start Time 0733   PT Stop Time 0811   PT Time Calculation (min) 38 min   Activity Tolerance Patient tolerated treatment well   Behavior During Therapy [facility] for tasks assessed/performed      Past Medical History  Diagnosis Date  . Hypertension   . GERD (gastroesophageal reflux disease)   . Sleep apnea 2012    wears a cpap at night    Past Surgical History  Procedure Laterality Date  . Rotator cuff repair  12/25/14    right shoulder  . Lumbar disc surgery    . Lumbar fusion      L5-S1  . Back surgery      screw placement  . Rib fracture surgery  2002    removal of left 9th rib    There were no vitals filed for this visit.  Visit Diagnosis:  Right shoulder pain  Shoulder stiffness, right  Muscle weakness of right upper extremity      Subjective Assessment - 05/08/15 0733    Subjective Reports soreness in R shoulder today. States that he tried to do a few things around his home yesterday.   Limitations Lifting   Patient Stated Goals Return right shoulder to previous level of function.   Currently in Pain? Yes   Pain Score 6    Pain Location Shoulder   Pain Orientation Right   Pain Descriptors / Indicators Sore   Pain Type Surgical pain   Pain Onset More than a month ago            [facility] PT Assessment - 05/08/15 0001    Assessment   Medical Diagnosis Right shoulder RCR.   Onset Date/Surgical Date 12/25/14                     [facility] Adult PT  Treatment/Exercise - 05/08/15 0001    Shoulder Exercises: Prone   Retraction Strengthening;Right;Weights  3x10 reps   Retraction Weight (lbs) 3   Extension Strengthening;Right;Weights  3x10 reps   Extension Weight (lbs) 3   Horizontal ABduction 1 Strengthening;Right  3x10 reps   Horizontal ABduction 1 Weight (lbs) 3   Shoulder Exercises: Sidelying   External Rotation Strengthening;Right;Weights  3x10 reps   External Rotation Weight (lbs) 3   Shoulder Exercises: Standing   Protraction Strengthening;Right;Theraband  3x10 reps   Theraband Level (Shoulder Protraction) Level 2 (Red)   External Rotation Strengthening;Right;Theraband  3x10 reps   Theraband Level (Shoulder External Rotation) Level 2 (Red)   Internal Rotation Strengthening;Right;Theraband  3x10 reps   Theraband Level (Shoulder Internal Rotation) Level 2 (Red)   Flexion Strengthening;Right;Weights  3x10 reps   Shoulder Flexion Weight (lbs) 2   ABduction Strengthening;Right;Weights  3x10 scaption; Experienced R shoulder grinding   Shoulder ABduction Weight (lbs) 2   Extension Strengthening;Right;Theraband  3x10 reps   Theraband Level (Shoulder Extension) Level 2 (Red)   Row Strengthening;Right;Theraband  3x10 reps   Theraband Level (Shoulder Row) Level  2 (Red)   Retraction Strengthening;Both  Pink XTS x30 reps   Other Standing Exercises wall circles each x fatigue   Other Standing Exercises wall pushups 2x10   Shoulder Exercises: Pulleys   Other Pulley Exercises UE Ranger elevation/circles each x30 reps   Shoulder Exercises: ROM/Strengthening   UBE (Upper Arm Bike) 90 RPM x8 min                     PT Long Term Goals - 05/04/15 0800    PT LONG TERM GOAL #1   Title Ind with a HEP.   Time 6   Period Weeks   Status Achieved   PT LONG TERM GOAL #2   Title Active right shoulder flexion to 155 degrees+.   Time 6   Period Weeks   Status Achieved   PT LONG TERM GOAL #3   Title Active ER to 80  degrees.   Time 6   Period Weeks   Status Achieved   PT LONG TERM GOAL #4   Title Patient reach behind back to L2 with right hand.   Time 6   Period Weeks   Status Achieved   PT LONG TERM GOAL #5   Title Right shoulder strength= 5/5.   Time 6   Period Weeks   Status On-going   PT LONG TERM GOAL #6   Title Patient able to perform ADL's with right shoulder pain not > 3/10.   Time 6   Period Weeks   Status Achieved  3/10               Plan - 05/08/15 6962    Clinical Impression Statement Patient tolerated today's treatment fairly well although he had increased soreness today. Completed all exercises with good technique and minimal multimodal cueing for the exercises directed. Experienced R shoulder grinding during R shoulder scaption with 2#. No increased weights were directed today except for prone R shoulder abduction which patient did not verbalize any increased pain. Patient experienced 6/10 R shoulder soreness following today's treatment.   Pt will benefit from skilled therapeutic intervention in order to improve on the following deficits Pain;Decreased activity tolerance;Decreased range of motion;Decreased strength   Rehab Potential Excellent   Clinical Impairments Affecting Rehab Potential surgury 12/25/14 current 19 weeks 05/08/15   PT Frequency 3x / week   PT Duration 6 weeks   PT Treatment/Interventions ADLs/Self Care Home Management;Cryotherapy;Electrical Stimulation;Moist Heat;Ultrasound;Therapeutic activities;Therapeutic exercise;Neuromuscular re-education;Patient/family education;Manual techniques;Passive range of motion;Iontophoresis /ml Dexamethasone   PT Next Visit Plan Continue with strengthening with increased hand weights per MPT.   Consulted and Agree with Plan of Care Patient        Problem List Patient Active Problem List   Diagnosis Date Noted  . Low testosterone 04/22/2015  . GERD (gastroesophageal reflux disease) 04/22/2015  . Barrett's  esophagus 04/22/2015  . Essential hypertension, benign 04/22/2015    Evelene Croon, PTA 05/08/2015, 8:17 AM  [facility] [address] [place], [place], [address] Phone: [phone]   Fax:  [phone]

## 2015-05-11 ENCOUNTER — Encounter: Payer: Self-pay | Admitting: Physical Therapy

## 2015-05-11 ENCOUNTER — Ambulatory Visit: Payer: Worker's Compensation | Admitting: Physical Therapy

## 2015-05-11 DIAGNOSIS — M25511 Pain in right shoulder: Secondary | ICD-10-CM

## 2015-05-11 DIAGNOSIS — M25611 Stiffness of right shoulder, not elsewhere classified: Secondary | ICD-10-CM

## 2015-05-11 DIAGNOSIS — M6281 Muscle weakness (generalized): Secondary | ICD-10-CM

## 2015-05-11 NOTE — Therapy (Signed)
[facility] [address] [place], [place], [address] Phone: [phone]   Fax:  [phone]  Physical Therapy Treatment  Patient Details  Name: Jeffrey Logan MRN: [id number] Date of Birth: 03/17/1956 Referring Provider:  Dettinger, Elige Radon, MD  Encounter Date: 05/11/2015      PT End of Session - 05/11/15 0807    Visit Number 37   Number of Visits 48   Date for PT Re-Evaluation 06/02/15   PT Start Time 0730   PT Stop Time 0811   PT Time Calculation (min) 41 min   Activity Tolerance Patient tolerated treatment well   Behavior During Therapy [facility] for tasks assessed/performed      Past Medical History  Diagnosis Date  . Hypertension   . GERD (gastroesophageal reflux disease)   . Sleep apnea 2012    wears a cpap at night    Past Surgical History  Procedure Laterality Date  . Rotator cuff repair  12/25/14    right shoulder  . Lumbar disc surgery    . Lumbar fusion      L5-S1  . Back surgery      screw placement  . Rib fracture surgery  2002    removal of left 9th rib    There were no vitals filed for this visit.  Visit Diagnosis:  Right shoulder pain  Shoulder stiffness, right  Muscle weakness of right upper extremity      Subjective Assessment - 05/11/15 0732    Subjective no new complaints today, soreness about the same per patient   Limitations Lifting   Patient Stated Goals Return right shoulder to previous level of function.   Currently in Pain? Yes   Pain Score 4    Pain Location Shoulder   Pain Orientation Right   Pain Descriptors / Indicators Sore   Pain Type Surgical pain   Pain Onset More than a month ago   Pain Frequency Constant   Aggravating Factors  raising arm overhead   Pain Relieving Factors rest                         [facility] Adult PT Treatment/Exercise - 05/11/15 0001    Shoulder Exercises: Supine   Protraction Strengthening;Right;Weights  3x10   Protraction Weight  (lbs) 4   Shoulder Exercises: Prone   Retraction Strengthening;Right;Weights  3x10   Retraction Weight (lbs) 4   Extension Strengthening;Right;Weights  3x10   Extension Weight (lbs) 4   Horizontal ABduction 1 Weights;Strengthening;Right  3x10   Horizontal ABduction 1 Weight (lbs) 3   Shoulder Exercises: Sidelying   External Rotation Strengthening;Right;Weights  3x10   External Rotation Weight (lbs) 4   Shoulder Exercises: Standing   External Rotation Strengthening;Right;Theraband  3x10   Theraband Level (Shoulder External Rotation) Level 2 (Red)   Internal Rotation Strengthening;Right;Theraband  3x10   Theraband Level (Shoulder Internal Rotation) Level 2 (Red)   Flexion Strengthening;Right;Weights  3x10   Shoulder Flexion Weight (lbs) 3   ABduction Strengthening;Right;Weights  3x10   Shoulder ABduction Weight (lbs) 2   Extension Strengthening;Right;Theraband  3x10   Theraband Level (Shoulder Extension) Level 2 (Red)   Row Strengthening;Right;Theraband  3x10   Theraband Level (Shoulder Row) Level 2 (Red)   Retraction Strengthening;Both  pink XTS x30   Other Standing Exercises incline pushups high mat table 2x10   Shoulder Exercises: ROM/Strengthening   UBE (Upper Arm Bike) 90 RPM x8 min   Shoulder Exercises: Body Blade   Flexion  60 seconds  flex/ext   External Rotation 60 seconds   Internal Rotation 60 seconds                     PT Long Term Goals - 05/04/15 0800    PT LONG TERM GOAL #1   Title Ind with a HEP.   Time 6   Period Weeks   Status Achieved   PT LONG TERM GOAL #2   Title Active right shoulder flexion to 155 degrees+.   Time 6   Period Weeks   Status Achieved   PT LONG TERM GOAL #3   Title Active ER to 80 degrees.   Time 6   Period Weeks   Status Achieved   PT LONG TERM GOAL #4   Title Patient reach behind back to L2 with right hand.   Time 6   Period Weeks   Status Achieved   PT LONG TERM GOAL #5   Title Right shoulder  strength= 5/5.   Time 6   Period Weeks   Status On-going   PT LONG TERM GOAL #6   Title Patient able to perform ADL's with right shoulder pain not > 3/10.   Time 6   Period Weeks   Status Achieved  3/10               Plan - 05/11/15 9604    Clinical Impression Statement Patient progressing with increased weights with exercises with no complaints of pain. Patient is able to perform more ADL's and activities. Patient still has complaints of popping in shoulder with overhead movements. Goals ongoing due full right shoulder strength deficits esp with overhead movements.   Pt will benefit from skilled therapeutic intervention in order to improve on the following deficits Pain;Decreased activity tolerance;Decreased range of motion;Decreased strength   Rehab Potential Excellent   Clinical Impairments Affecting Rehab Potential surgury 12/25/14 current 19 weeks 05/08/15   PT Frequency 3x / week   PT Duration 6 weeks   PT Treatment/Interventions ADLs/Self Care Home Management;Cryotherapy;Electrical Stimulation;Moist Heat;Ultrasound;Therapeutic activities;Therapeutic exercise;Neuromuscular re-education;Patient/family education;Manual techniques;Passive range of motion;Iontophoresis /ml Dexamethasone   PT Next Visit Plan Continue with strengthening with increased hand weights per MPT.   Consulted and Agree with Plan of Care Patient        Problem List Patient Active Problem List   Diagnosis Date Noted  . Low testosterone 04/22/2015  . GERD (gastroesophageal reflux disease) 04/22/2015  . Barrett's esophagus 04/22/2015  . Essential hypertension, benign 04/22/2015    Rubin Dais P, PTA 05/11/2015, 8:13 AM  [facility] [address] [place], [place], [address] Phone: [phone]   Fax:  [phone]

## 2015-05-13 ENCOUNTER — Encounter: Payer: Self-pay | Admitting: Physical Therapy

## 2015-05-13 ENCOUNTER — Ambulatory Visit: Payer: Worker's Compensation | Admitting: Physical Therapy

## 2015-05-13 DIAGNOSIS — M25511 Pain in right shoulder: Secondary | ICD-10-CM | POA: Diagnosis not present

## 2015-05-13 DIAGNOSIS — M25611 Stiffness of right shoulder, not elsewhere classified: Secondary | ICD-10-CM

## 2015-05-13 DIAGNOSIS — M6281 Muscle weakness (generalized): Secondary | ICD-10-CM

## 2015-05-13 NOTE — Therapy (Signed)
[facility] [address] [place], [place], [address] Phone: [phone]   Fax:  [phone]  Physical Therapy Treatment  Patient Details  Name: Jeffrey Logan MRN: [id number] Date of Birth: 08-May-1956 Referring Provider:  Dettinger, Elige Radon, MD  Encounter Date: 05/13/2015      PT End of Session - 05/13/15 0856    Visit Number 38   Number of Visits 48   Date for PT Re-Evaluation 06/02/15   PT Start Time 0819   PT Stop Time 0900   PT Time Calculation (min) 41 min   Activity Tolerance Patient tolerated treatment well   Behavior During Therapy [facility] for tasks assessed/performed      Past Medical History  Diagnosis Date  . Hypertension   . GERD (gastroesophageal reflux disease)   . Sleep apnea 2012    wears a cpap at night    Past Surgical History  Procedure Laterality Date  . Rotator cuff repair  12/25/14    right shoulder  . Lumbar disc surgery    . Lumbar fusion      L5-S1  . Back surgery      screw placement  . Rib fracture surgery  2002    removal of left 9th rib    There were no vitals filed for this visit.  Visit Diagnosis:  Right shoulder pain  Shoulder stiffness, right  Muscle weakness of right upper extremity      Subjective Assessment - 05/13/15 0820    Subjective no new complaints today, soreness about the same per patient   Limitations Lifting   Patient Stated Goals Return right shoulder to previous level of function.   Currently in Pain? Yes   Pain Score 3    Pain Location Shoulder   Pain Orientation Right   Pain Type Surgical pain   Pain Onset More than a month ago   Pain Frequency Intermittent   Aggravating Factors  raising arm overhead   Pain Relieving Factors rest            [facility] PT Assessment - 05/13/15 0001    Strength   Overall Strength Deficits;Within functional limits for tasks performed   Strength Assessment Site Shoulder   Right/Left Shoulder Right   Right Shoulder Flexion  4+/5   Right Shoulder ABduction 4/5   Right Shoulder Internal Rotation 5/5   Right Shoulder External Rotation 4+/5                     [facility] Adult PT Treatment/Exercise - 05/13/15 0001    Shoulder Exercises: Supine   Protraction Strengthening;Right;Weights  3x10   Protraction Weight (lbs) 4   Shoulder Exercises: Prone   Retraction Strengthening;Right;Weights  3x10   Retraction Weight (lbs) 4   Extension Strengthening;Right;Weights  3x10   Extension Weight (lbs) 4   Horizontal ABduction 1 Weights;Strengthening;Right  3x10   Horizontal ABduction 1 Weight (lbs) 3   Shoulder Exercises: Sidelying   External Rotation Strengthening;Right;Weights  3x10   External Rotation Weight (lbs) 4   Shoulder Exercises: Standing   External Rotation Strengthening;Right;Theraband  3x10   Theraband Level (Shoulder External Rotation) Level 2 (Red)   Internal Rotation Strengthening;Right;Theraband  3x10   Theraband Level (Shoulder Internal Rotation) Level 2 (Red)   Flexion Strengthening;Right;Weights  3x10   Shoulder Flexion Weight (lbs) 3   ABduction Strengthening;Right;Weights  3x10   Shoulder ABduction Weight (lbs) 2   Extension Strengthening;Right;Theraband  3x10   Theraband Level (Shoulder Extension) Level 2 (Red)  Row Strengthening;Right;Theraband  3x10   Theraband Level (Shoulder Row) Level 2 (Red)   Retraction Strengthening;Both  Orange XTS 3x10   Other Standing Exercises overhead punches 3# 2x10   Shoulder Exercises: ROM/Strengthening   UBE (Upper Arm Bike) 60 RPM x8 min                     PT Long Term Goals - 05/04/15 0800    PT LONG TERM GOAL #1   Title Ind with a HEP.   Time 6   Period Weeks   Status Achieved   PT LONG TERM GOAL #2   Title Active right shoulder flexion to 155 degrees+.   Time 6   Period Weeks   Status Achieved   PT LONG TERM GOAL #3   Title Active ER to 80 degrees.   Time 6   Period Weeks   Status Achieved   PT LONG  TERM GOAL #4   Title Patient reach behind back to L2 with right hand.   Time 6   Period Weeks   Status Achieved   PT LONG TERM GOAL #5   Title Right shoulder strength= 5/5.   Time 6   Period Weeks   Status On-going   PT LONG TERM GOAL #6   Title Patient able to perform ADL's with right shoulder pain not > 3/10.   Time 6   Period Weeks   Status Achieved  3/10               Plan - 05/13/15 0857    Clinical Impression Statement Patient continues to progress with strength activities with slight weight increase with no difficulty or pain. Improving right shoulder strength yet unable to meet goal at this time.    Pt will benefit from skilled therapeutic intervention in order to improve on the following deficits Pain;Decreased activity tolerance;Decreased range of motion;Decreased strength   Rehab Potential Excellent   Clinical Impairments Affecting Rehab Potential surgury 12/25/14 current 19 weeks 05/08/15   PT Frequency 3x / week   PT Duration 6 weeks   PT Treatment/Interventions ADLs/Self Care Home Management;Cryotherapy;Electrical Stimulation;Moist Heat;Ultrasound;Therapeutic activities;Therapeutic exercise;Neuromuscular re-education;Patient/family education;Manual techniques;Passive range of motion;Iontophoresis 4mg /ml Dexamethasone   PT Next Visit Plan Continue with strengthening with increased hand weights per MPT.   Consulted and Agree with Plan of Care Patient        Problem List Patient Active Problem List   Diagnosis Date Noted  . Low testosterone 04/22/2015  . GERD (gastroesophageal reflux disease) 04/22/2015  . Barrett's esophagus 04/22/2015  . Essential hypertension, benign 04/22/2015    Hermelinda DellenDUNFORD, CHRISTINA P, PTA 05/13/2015, 9:07 AM  [facility] [address] [place], [place], [address] Phone: [phone]   Fax:  [phone]

## 2015-05-15 ENCOUNTER — Ambulatory Visit: Payer: Worker's Compensation | Admitting: *Deleted

## 2015-05-15 ENCOUNTER — Encounter: Payer: Self-pay | Admitting: *Deleted

## 2015-05-15 DIAGNOSIS — M25611 Stiffness of right shoulder, not elsewhere classified: Secondary | ICD-10-CM

## 2015-05-15 DIAGNOSIS — M6281 Muscle weakness (generalized): Secondary | ICD-10-CM

## 2015-05-15 DIAGNOSIS — M25511 Pain in right shoulder: Secondary | ICD-10-CM | POA: Diagnosis not present

## 2015-05-15 NOTE — Therapy (Signed)
[facility] [address] [place], [place], [address] Phone: [phone]   Fax:  [phone]  Physical Therapy Treatment  Patient Details  Name: Traveon Louro MRN: [id number] Date of Birth: [birth year] No Data Recorded  Encounter Date: 05/15/2015      PT End of Session - 05/15/15 0934    Visit Number 39   Number of Visits 48   Date for PT Re-Evaluation 06/02/15   PT Start Time 0902   PT Stop Time 0955   PT Time Calculation (min) 53 min      Past Medical History  Diagnosis Date  . Hypertension   . GERD (gastroesophageal reflux disease)   . Sleep apnea 2012    wears a cpap at night    Past Surgical History  Procedure Laterality Date  . Rotator cuff repair  12/25/14    right shoulder  . Lumbar disc surgery    . Lumbar fusion      L5-S1  . Back surgery      screw placement  . Rib fracture surgery  2002    removal of left 9th rib    There were no vitals filed for this visit.  Visit Diagnosis:  Right shoulder pain  Shoulder stiffness, right  Muscle weakness of right upper extremity      Subjective Assessment - 05/15/15 0913    Subjective no new complaints today, soreness about the same per patient   Limitations Lifting   Patient Stated Goals Return right shoulder to previous level of function.   Currently in Pain? Yes   Pain Score 5    Pain Location Shoulder   Pain Orientation Right   Pain Descriptors / Indicators Sore   Pain Onset More than a month ago   Pain Frequency Intermittent   Aggravating Factors  raising arm overhead   Pain Relieving Factors rest                         [facility] Adult PT Treatment/Exercise - 05/15/15 0001    Exercises   Exercises Shoulder;Elbow   Elbow Exercises   Elbow Flexion Strengthening  4# 3x 15   Shoulder Exercises: Prone   Extension Strengthening;Right;Weights  3x10   Extension Weight (lbs) 4   Horizontal ABduction 1 Weights;Strengthening;Right  3x10   Horizontal ABduction 1 Weight (lbs) 3   Shoulder Exercises: Sidelying   External Rotation Strengthening;Right;Weights  3x10   External Rotation Weight (lbs) 4   Shoulder Exercises: Standing   Flexion Strengthening;Right;Weights  4x10   Shoulder Flexion Weight (lbs) 3   ABduction Strengthening;Right;Weights  3x10   Shoulder ABduction Weight (lbs) 2   Other Standing Exercises overhead punches 2# 3x10   Shoulder Exercises: ROM/Strengthening   UBE (Upper Arm Bike) 60 RPM x8 min                                                                                                  2# BALL D1/D2 x 10 each  Prone rows 5# 3x10               PT Long Term Goals - 05/04/15 0800    PT LONG TERM GOAL #1   Title Ind with a HEP.   Time 6   Period Weeks   Status Achieved   PT LONG TERM GOAL #2   Title Active right shoulder flexion to 155 degrees+.   Time 6   Period Weeks   Status Achieved   PT LONG TERM GOAL #3   Title Active ER to 80 degrees.   Time 6   Period Weeks   Status Achieved   PT LONG TERM GOAL #4   Title Patient reach behind back to L2 with right hand.   Time 6   Period Weeks   Status Achieved   PT LONG TERM GOAL #5   Title Right shoulder strength= 5/5.   Time 6   Period Weeks   Status On-going   PT LONG TERM GOAL #6   Title Patient able to perform ADL's with right shoulder pain not > 3/10.   Time 6   Period Weeks   Status Achieved  3/10               Plan - 05/15/15 0934    Clinical Impression Statement Pt did well with Rx today, but continues to have pain and popping in RT ACJ that limits some exs and motions. We added some PNF D1/D2 motions today. Goals are ongoing   Pt will benefit from skilled therapeutic intervention in order to improve on the following deficits Pain;Decreased activity tolerance;Decreased range of motion;Decreased strength   Rehab  Potential Excellent   Clinical Impairments Affecting Rehab Potential surgury 12/25/14 current 19 weeks 05/08/15   PT Frequency 3x / week   PT Duration 6 weeks   PT Treatment/Interventions ADLs/Self Care Home Management;Cryotherapy;Electrical Stimulation;Moist Heat;Ultrasound;Therapeutic activities;Therapeutic exercise;Neuromuscular re-education;Patient/family education;Manual techniques;Passive range of motion;Iontophoresis 4mg /ml Dexamethasone   PT Next Visit Plan Continue with strengthening with increased hand weights per MPT. PNF   Consulted and Agree with Plan of Care Patient        Problem List Patient Active Problem List   Diagnosis Date Noted  . Low testosterone 04/22/2015  . GERD (gastroesophageal reflux disease) 04/22/2015  . Barrett's esophagus 04/22/2015  . Essential hypertension, benign 04/22/2015    Bradden Tadros,CHRISPTA 05/15/2015, 10:03 AM  [facility] [address] [place], [place], [address] Phone: [phone]   Fax:  [phone]  Name: Elizebeth Brookingllen Berthelot MRN: [id number] Date of Birth: 05/16/1956

## 2015-05-18 ENCOUNTER — Ambulatory Visit: Payer: Worker's Compensation | Admitting: Physical Therapy

## 2015-05-18 DIAGNOSIS — M6281 Muscle weakness (generalized): Secondary | ICD-10-CM

## 2015-05-18 DIAGNOSIS — M25511 Pain in right shoulder: Secondary | ICD-10-CM

## 2015-05-18 NOTE — Therapy (Signed)
[facility] [address] [place], [place], [address] Phone: [phone]   Fax:  [phone]  Physical Therapy Treatment  Patient Details  Name: Jeffrey Logan MRN: [id number] Date of Birth: 01-25-1956 No Data Recorded  Encounter Date: 05/18/2015      PT End of Session - 05/18/15 0826    Visit Number 40   Number of Visits 48   Date for PT Re-Evaluation 06/02/15   PT Start Time 0825   PT Stop Time 0903   PT Time Calculation (min) 38 min   Activity Tolerance Patient tolerated treatment well   Behavior During Therapy [facility] for tasks assessed/performed      Past Medical History  Diagnosis Date  . Hypertension   . GERD (gastroesophageal reflux disease)   . Sleep apnea 2012    wears a cpap at night    Past Surgical History  Procedure Laterality Date  . Rotator cuff repair  12/25/14    right shoulder  . Lumbar disc surgery    . Lumbar fusion      L5-S1  . Back surgery      screw placement  . Rib fracture surgery  2002    removal of left 9th rib    There were no vitals filed for this visit.  Visit Diagnosis:  Muscle weakness of right upper extremity  Right shoulder pain      Subjective Assessment - 05/18/15 0826    Subjective Patient 10 min late. A little sore.   Currently in Pain? Yes   Pain Score 4    Pain Location Shoulder   Pain Orientation Right   Pain Descriptors / Indicators Sore   Pain Type Surgical pain   Pain Onset More than a month ago                         [facility] Adult PT Treatment/Exercise - 05/18/15 0001    Elbow Exercises   Elbow Flexion Strengthening  5# 3x 15   Shoulder Exercises: Supine   Protraction Strengthening;Right;Weights  3x10   Protraction Weight (lbs) 4   Shoulder Exercises: Standing   Flexion Strengthening;Right;Weights;20 reps  2x10 with 3 sec hold   Shoulder Flexion Weight (lbs) 3   Flexion Limitations attempted 4# 1x10 too painful   ABduction  Strengthening;Right;Weights  3x10   Shoulder ABduction Weight (lbs) 3   Extension Strengthening;Right;Weights  3x10   Extension Weight (lbs) 4   Other Standing Exercises overhead punches 2# 3x10   Other Standing Exercises lawnmower pull 6# 3x10; PNF D1/D2 with 2# ball 2 x10 (changed D2 flex/ext to supine to stop popping in shoulder   Shoulder Exercises: ROM/Strengthening   UBE (Upper Arm Bike) 60 RPM x8 min                     PT Long Term Goals - 05/04/15 0800    PT LONG TERM GOAL #1   Title Ind with a HEP.   Time 6   Period Weeks   Status Achieved   PT LONG TERM GOAL #2   Title Active right shoulder flexion to 155 degrees+.   Time 6   Period Weeks   Status Achieved   PT LONG TERM GOAL #3   Title Active ER to 80 degrees.   Time 6   Period Weeks   Status Achieved   PT LONG TERM GOAL #4   Title Patient reach behind back to L2 with right hand.  Time 6   Period Weeks   Status Achieved   PT LONG TERM GOAL #5   Title Right shoulder strength= 5/5.   Time 6   Period Weeks   Status On-going   PT LONG TERM GOAL #6   Title Patient able to perform ADL's with right shoulder pain not > 3/10.   Time 6   Period Weeks   Status Achieved  3/10               Plan - 05/18/15 0905    Clinical Impression Statement Patient tolerated increase in weights in most exercises except standing flexion. He has popping with standing flexion and D2 flex/ext causing pain but is able to eliminate with supine position suggesting continued instability of posterior RC and scapular stabilizers.    PT Next Visit Plan Focus on posterior cuff and scapular stabilizers to eliminate popping. Continue to increase weights as tolerated.   Consulted and Agree with Plan of Care Patient        Problem List Patient Active Problem List   Diagnosis Date Noted  . Low testosterone 04/22/2015  . GERD (gastroesophageal reflux disease) 04/22/2015  . Barrett's esophagus 04/22/2015  . Essential  hypertension, benign 04/22/2015    Solon PalmJulie Liyla Radliff PT  05/18/2015, 9:09 AM  [facility] [address] [place], [place], [address] Phone: [phone]   Fax:  [phone]  Name: Jeffrey Logan MRN: [id number] Date of Birth: 08/10/1955

## 2015-05-20 ENCOUNTER — Ambulatory Visit: Payer: Worker's Compensation | Admitting: Physical Therapy

## 2015-05-20 ENCOUNTER — Encounter: Payer: Self-pay | Admitting: Physical Therapy

## 2015-05-20 DIAGNOSIS — M6281 Muscle weakness (generalized): Secondary | ICD-10-CM

## 2015-05-20 DIAGNOSIS — M25511 Pain in right shoulder: Secondary | ICD-10-CM | POA: Diagnosis not present

## 2015-05-20 DIAGNOSIS — M25611 Stiffness of right shoulder, not elsewhere classified: Secondary | ICD-10-CM

## 2015-05-20 NOTE — Therapy (Signed)
[facility] [address] [place], [place], [address] Phone: [phone]   Fax:  [phone]  Physical Therapy Treatment  Patient Details  Name: Jeffrey Logan MRN: [id number] Date of Birth: 10/22/1955 No Data Recorded  Encounter Date: 05/20/2015      PT End of Session - 05/20/15 0937    Visit Number 41   Number of Visits 48   PT Start Time 0859   PT Stop Time 0941   PT Time Calculation (min) 42 min   Activity Tolerance Patient tolerated treatment well   Behavior During Therapy [facility] for tasks assessed/performed      Past Medical History  Diagnosis Date  . Hypertension   . GERD (gastroesophageal reflux disease)   . Sleep apnea 2012    wears a cpap at night    Past Surgical History  Procedure Laterality Date  . Rotator cuff repair  12/25/14    right shoulder  . Lumbar disc surgery    . Lumbar fusion      L5-S1  . Back surgery      screw placement  . Rib fracture surgery  2002    removal of left 9th rib    There were no vitals filed for this visit.  Visit Diagnosis:  Muscle weakness of right upper extremity  Right shoulder pain  Shoulder stiffness, right      Subjective Assessment - 05/20/15 0905    Subjective some soreness today for unknown reason   Limitations Lifting   Patient Stated Goals Return right shoulder to previous level of function.   Currently in Pain? Yes   Pain Score 5    Pain Location Shoulder   Pain Orientation Right   Pain Descriptors / Indicators Sore   Pain Type Surgical pain   Pain Onset More than a month ago   Pain Frequency Intermittent   Aggravating Factors  raising arm overhead   Pain Relieving Factors rest            [facility] PT Assessment - 05/20/15 0001    Strength   Overall Strength Deficits;Within functional limits for tasks performed   Strength Assessment Site Shoulder   Right/Left Shoulder Right   Right Shoulder Flexion 4+/5  4 to 4+/5   Right Shoulder ABduction 4/5   Right Shoulder Internal Rotation 5/5   Right Shoulder External Rotation 4+/5  4 to 4+/5                     [facility] Adult PT Treatment/Exercise - 05/20/15 0001    Elbow Exercises   Elbow Flexion Strengthening  5# 3x15   Shoulder Exercises: Prone   Retraction Strengthening;Right;Weights  3x10   Retraction Weight (lbs) 6   Extension Strengthening;Right;Weights  3x10   Extension Weight (lbs) 6   Shoulder Exercises: Sidelying   External Rotation Strengthening;Right;Weights  3x10   External Rotation Weight (lbs) 4   Shoulder Exercises: Standing   External Rotation Strengthening;Right;Theraband  3x10   Theraband Level (Shoulder External Rotation) Level 2 (Red)   Internal Rotation Strengthening;Right;Theraband  3x10   Theraband Level (Shoulder Internal Rotation) Level 2 (Red)   Flexion Strengthening;Right;Weights;20 reps  3x10   Shoulder Flexion Weight (lbs) 3   ABduction Strengthening;Right;Weights  3x10   Shoulder ABduction Weight (lbs) 3   Extension Strengthening;Right;Theraband  3x10   Theraband Level (Shoulder Extension) Level 2 (Red)   Row Strengthening;Right;Theraband  3x10   Theraband Level (Shoulder Row) Level 2 (Red)   Retraction Strengthening;Both  orange XTS  3x10   Other Standing Exercises overhead punches 3# 3x10   Shoulder Exercises: ROM/Strengthening   UBE (Upper Arm Bike) 60 RPM x8 min                     PT Long Term Goals - 05/20/15 [id number]    PT LONG TERM GOAL #1   Title Ind with a HEP.   Time 6   Period Weeks   Status Achieved   PT LONG TERM GOAL #2   Title Active right shoulder flexion to 155 degrees+.   Time 6   Period Weeks   Status Achieved   PT LONG TERM GOAL #3   Title Active ER to 80 degrees.   Time 6   Period Weeks   Status Achieved   PT LONG TERM GOAL #4   Title Patient reach behind back to L2 with right hand.   Time 6   Period Weeks   Status Achieved   PT LONG TERM GOAL #5   Title Right shoulder strength=  5/5.   Time 6   Period Weeks   Status On-going   PT LONG TERM GOAL #6   Title Patient able to perform ADL's with right shoulder pain not > 3/10.   Time 6   Period Weeks   Status Achieved               Plan - 05/20/15 0942    Clinical Impression Statement Patient progressing with strengthening activities and still has some pain and popping with overhead movements. Patient has increased soreness with certain movements. Patient unable to meet strength goal due weakness and pain in right shoulder.   Pt will benefit from skilled therapeutic intervention in order to improve on the following deficits Pain;Decreased activity tolerance;Decreased range of motion;Decreased strength   Rehab Potential Excellent   Clinical Impairments Affecting Rehab Potential surgury 12/25/14 current 19 weeks 05/08/15   PT Frequency 3x / week   PT Duration 6 weeks   PT Treatment/Interventions ADLs/Self Care Home Management;Cryotherapy;Electrical Stimulation;Moist Heat;Ultrasound;Therapeutic activities;Therapeutic exercise;Neuromuscular re-education;Patient/family education;Manual techniques;Passive range of motion;Iontophoresis 4mg /ml Dexamethasone   PT Next Visit Plan add combo to POC next treatment per MPT, Patient is to discuss order for another order for ionto patches ( MD. Yisroel Rammingaldorf friday)   Consulted and Agree with Plan of Care Patient        Problem List Patient Active Problem List   Diagnosis Date Noted  . Low testosterone 04/22/2015  . GERD (gastroesophageal reflux disease) 04/22/2015  . Barrett's esophagus 04/22/2015  . Essential hypertension, benign 04/22/2015     APPLEGATE, ItalyHAD, PTA 05/20/2015, 12:02 PM  Italyhad Applegate MPT [facility] [address] [place], [place], [address] Phone: [phone]   Fax:  [phone]  Name: Jeffrey Logan MRN: [id number] Date of Birth: 09/04/1955

## 2015-05-25 ENCOUNTER — Encounter: Payer: Self-pay | Admitting: Physical Therapy

## 2015-05-27 ENCOUNTER — Encounter: Payer: Self-pay | Admitting: Physical Therapy

## 2015-05-29 ENCOUNTER — Encounter: Payer: Self-pay | Admitting: *Deleted

## 2015-06-02 ENCOUNTER — Ambulatory Visit (INDEPENDENT_AMBULATORY_CARE_PROVIDER_SITE_OTHER): Payer: Managed Care, Other (non HMO)

## 2015-06-02 ENCOUNTER — Encounter: Payer: Self-pay | Admitting: Family Medicine

## 2015-06-02 ENCOUNTER — Ambulatory Visit (INDEPENDENT_AMBULATORY_CARE_PROVIDER_SITE_OTHER): Payer: Managed Care, Other (non HMO) | Admitting: Family Medicine

## 2015-06-02 VITALS — BP 107/68 | HR 84 | Temp 98.0°F | Ht 72.0 in | Wt 192.4 lb

## 2015-06-02 DIAGNOSIS — R1030 Lower abdominal pain, unspecified: Secondary | ICD-10-CM

## 2015-06-02 DIAGNOSIS — K5732 Diverticulitis of large intestine without perforation or abscess without bleeding: Secondary | ICD-10-CM | POA: Diagnosis not present

## 2015-06-02 MED ORDER — CIPROFLOXACIN HCL 500 MG PO TABS: 500.0000 mg | ORAL_TABLET | Freq: Two times a day (BID) | ORAL | Status: DC

## 2015-06-02 MED ORDER — METRONIDAZOLE 500 MG PO TABS: 500.0000 mg | ORAL_TABLET | Freq: Three times a day (TID) | ORAL | Status: DC

## 2015-06-02 MED ORDER — ONDANSETRON 8 MG PO TBDP
8.0000 mg | ORAL_TABLET | Freq: Four times a day (QID) | ORAL | Status: DC | PRN
Start: 2015-06-02 — End: 2015-08-24

## 2015-06-02 NOTE — Progress Notes (Signed)
Subjective:  Patient ID: Jeffrey Logan, male    DOB: Jan 16, 1956  Age: 59 y.o. MRN: [id number]  CC: Abdominal Pain   HPI Akiva Brassfield presents for severe lower abdominal pain. He described as cramping. He is not having vomiting or diarrhea. He has been nauseous with it though. The pain is described as a severe cramp. 8/10. Had some sweating and chills as well. Onset was yesterday evening. No similar symptoms in the past. No history of surgery. No dysuria. He has been unable to function at work due to this. His appetite is poor. He is taking in fluids.  History Jeffrey Logan has a past medical history of Hypertension; GERD (gastroesophageal reflux disease); and Sleep apnea (2012).   He has past surgical history that includes Rotator cuff repair (12/25/14); Lumbar disc surgery; Lumbar fusion; Back surgery; and Rib fracture surgery (2002).   His family history includes Diabetes in his mother; Heart disease in his brother and father.He reports that he quit smoking about 7 weeks ago. His smoking use included Cigarettes. He has a 15 pack-year smoking history. He has never used smokeless tobacco. He reports that he drinks about 3.0 oz of alcohol per week. He reports that he does not use illicit drugs.  Outpatient Prescriptions Prior to Visit  Medication Sig Dispense Refill  . amLODipine (NORVASC) 10 MG tablet Take 10 mg by mouth daily.    Marland Kitchen aspirin 81 MG tablet Take 81 mg by mouth daily.    Marland Kitchen omeprazole (PRILOSEC) 40 MG capsule Take 40 mg by mouth daily.    . traMADol (ULTRAM) 50 MG tablet Take by mouth daily.    Marland Kitchen testosterone (ANDRODERM) 4 MG/24HR PT24 patch Place 1 patch onto the skin daily.     No facility-administered medications prior to visit.    ROS Review of Systems  Constitutional: Positive for activity change (decreased). Negative for chills, diaphoresis and fatigue.  HENT: Negative for congestion and rhinorrhea.   Respiratory: Negative for cough and shortness of breath.   Cardiovascular:  Negative for chest pain and palpitations.  Gastrointestinal: Positive for abdominal pain and abdominal distention. Negative for nausea, vomiting, diarrhea and constipation.  Genitourinary: Negative for dysuria and frequency.  Musculoskeletal: Negative for myalgias and arthralgias.  Skin: Negative for rash and wound.  All other systems reviewed and are negative.   Objective:  BP 107/68 mmHg  Pulse 84  Temp(Src) 98 F (36.7 C) (Oral)  Ht 6' (1.829 m)  Wt 192 lb 6.4 oz (87.272 kg)  BMI 26.09 kg/m2  BP Readings from Last 3 Encounters:  06/02/15 107/68  04/22/15 120/78    Wt Readings from Last 3 Encounters:  06/02/15 192 lb 6.4 oz (87.272 kg)  04/22/15 191 lb (86.637 kg)     Physical Exam  Constitutional: He is oriented to person, place, and time. He appears well-developed and well-nourished. No distress.  HENT:  Head: Normocephalic and atraumatic.  Right Ear: External ear normal.  Left Ear: External ear normal.  Nose: Nose normal.  Mouth/Throat: Oropharynx is clear and moist.  Eyes: Conjunctivae and EOM are normal. Pupils are equal, round, and reactive to light.  Neck: Normal range of motion. Neck supple. No thyromegaly present.  Cardiovascular: Normal rate, regular rhythm and normal heart sounds.   No murmur heard. Pulmonary/Chest: Effort normal and breath sounds normal. No respiratory distress. He has no wheezes. He has no rales.  Abdominal: Soft. Bowel sounds are normal. He exhibits distension. There is tenderness (both lower quadrants much greater on the left  than the right).  Lymphadenopathy:    He has no cervical adenopathy.  Neurological: He is alert and oriented to person, place, and time. He has normal reflexes.  Skin: Skin is warm and dry.  Psychiatric: He has a normal mood and affect. His behavior is normal. Judgment and thought content normal.    No results found for: HGBA1C  Lab Results  Component Value Date   WBC 7.8 06/02/2015   HCT 39.8 06/02/2015    GLUCOSE 83 06/02/2015   ALT 21 06/02/2015   AST 23 06/02/2015   NA 138 06/02/2015   K 4.2 06/02/2015   CL 98 06/02/2015   CREATININE 1.15 06/02/2015   BUN 14 06/02/2015   CO2 26 06/02/2015    Ct Chest Wo Contrast  11/18/2010  *RADIOLOGY REPORT* Clinical Data: left chest pain after fall; history of left ninth rib removal CT CHEST WITHOUT CONTRAST Technique:  Multidetector CT imaging of the chest was performed following the standard protocol without IV contrast. Comparison: None. Findings: . Both lungs are clear.  There is no axillary, mediastinal, or gross hilar adenopathy.  There is no evidence of rib fracture or pleural thickening bilaterally. No pneumothorax. There is partial absence of a mid left rib, consistent with history of 9th left rib removal.  The visualized upper abdomen is unremarkable. IMPRESSION: Normal CT scan of the chest. Original Report Authenticated By: Duayne Cal, M.D.   Assessment & Plan:   [place] was seen today for abdominal pain.  Diagnoses and all orders for this visit:  Lower abdominal pain -     DG Abd 1 View -     CBC with Differential/Platelet -     CMP14+EGFR -     Lipase  Diverticulitis of colon without hemorrhage -     CBC with Differential/Platelet -     CMP14+EGFR -     Lipase  Other orders -     metroNIDAZOLE (FLAGYL) 500 MG tablet; Take 1 tablet (500 mg total) by mouth 3 (three) times daily. -     ciprofloxacin (CIPRO) 500 MG tablet; Take 1 tablet (500 mg total) by mouth 2 (two) times daily. -     ondansetron (ZOFRAN-ODT) 8 MG disintegrating tablet; Take 1 tablet (8 mg total) by mouth every 6 (six) hours as needed for nausea or vomiting.   I am having Mr. Dineen start on metroNIDAZOLE, ciprofloxacin, and ondansetron. I am also having him maintain his amLODipine, omeprazole, testosterone, traMADol, aspirin, and diclofenac.  Meds ordered this encounter  Medications  . diclofenac (VOLTAREN) 75 MG EC tablet    Sig: Take 75 mg by mouth 2 (two)  times daily.   . metroNIDAZOLE (FLAGYL) 500 MG tablet    Sig: Take 1 tablet (500 mg total) by mouth 3 (three) times daily.    Dispense:  30 tablet    Refill:  0  . ciprofloxacin (CIPRO) 500 MG tablet    Sig: Take 1 tablet (500 mg total) by mouth 2 (two) times daily.    Dispense:  20 tablet    Refill:  0  . ondansetron (ZOFRAN-ODT) 8 MG disintegrating tablet    Sig: Take 1 tablet (8 mg total) by mouth every 6 (six) hours as needed for nausea or vomiting.    Dispense:  30 tablet    Refill:  1   Abdominal x-ray negative for obstruction or stone.  Follow-up: Return in about 2 days (around 06/04/2015) for abd pain.  Claretta Fraise, M.D.

## 2015-06-03 LAB — LIPASE: LIPASE: 35 U/L (ref 0–59)

## 2015-06-03 LAB — CMP14+EGFR
ALT: 21 IU/L (ref 0–44)
AST: 23 IU/L (ref 0–40)
Albumin/Globulin Ratio: 1.3 (ref 1.1–2.5)
Albumin: 3.6 g/dL (ref 3.5–5.5)
Alkaline Phosphatase: 89 IU/L (ref 39–117)
BILIRUBIN TOTAL: 0.7 mg/dL (ref 0.0–1.2)
BUN/Creatinine Ratio: 12 (ref 9–20)
BUN: 14 mg/dL (ref 6–24)
CALCIUM: 8.7 mg/dL (ref 8.7–10.2)
CO2: 26 mmol/L (ref 18–29)
Chloride: 98 mmol/L (ref 97–106)
Creatinine, Ser: 1.15 mg/dL (ref 0.76–1.27)
GFR calc Af Amer: 81 mL/min/{1.73_m2} (ref >59)
GFR, EST NON AFRICAN AMERICAN: 70 mL/min/{1.73_m2} (ref >59)
GLOBULIN, TOTAL: 2.7 g/dL (ref 1.5–4.5)
Glucose: 83 mg/dL (ref 65–99)
POTASSIUM: 4.2 mmol/L (ref 3.5–5.2)
SODIUM: 138 mmol/L (ref 136–144)
Total Protein: 6.3 g/dL (ref 6.0–8.5)

## 2015-06-03 LAB — CBC WITH DIFFERENTIAL/PLATELET
BASOS: 0 %
Basophils Absolute: 0 10*3/uL (ref 0.0–0.2)
EOS (ABSOLUTE): 0 10*3/uL (ref 0.0–0.4)
EOS: 1 %
HEMATOCRIT: 39.8 % (ref 37.5–51.0)
Hemoglobin: 13.5 g/dL (ref 12.6–17.7)
IMMATURE GRANULOCYTES: 0 %
Immature Grans (Abs): 0 10*3/uL (ref 0.0–0.1)
LYMPHS ABS: 2.5 10*3/uL (ref 0.7–3.1)
Lymphs: 32 %
MCH: 31.7 pg (ref 26.6–33.0)
MCHC: 33.9 g/dL (ref 31.5–35.7)
MCV: 93 fL (ref 79–97)
MONOS ABS: 1 10*3/uL — AB (ref 0.1–0.9)
Monocytes: 13 %
NEUTROS ABS: 4.3 10*3/uL (ref 1.4–7.0)
Neutrophils: 54 %
PLATELETS: 147 10*3/uL — AB (ref 150–379)
RBC: 4.26 x10E6/uL (ref 4.14–5.80)
RDW: 14.4 % (ref 12.3–15.4)
WBC: 7.8 10*3/uL (ref 3.4–10.8)

## 2015-06-04 ENCOUNTER — Encounter: Payer: Self-pay | Admitting: Family Medicine

## 2015-06-04 ENCOUNTER — Ambulatory Visit (INDEPENDENT_AMBULATORY_CARE_PROVIDER_SITE_OTHER): Payer: Managed Care, Other (non HMO) | Admitting: Family Medicine

## 2015-06-04 ENCOUNTER — Ambulatory Visit (HOSPITAL_COMMUNITY)
Admission: RE | Admit: 2015-06-04 | Discharge: 2015-06-04 | Disposition: A | Discharge status: Home / Self Care | Payer: Managed Care, Other (non HMO) | Source: Ambulatory Visit | Attending: Family Medicine | Admitting: Family Medicine

## 2015-06-04 VITALS — BP 118/82 | HR 69 | Temp 98.0°F | Ht 72.0 in | Wt 192.2 lb

## 2015-06-04 DIAGNOSIS — Z720 Tobacco use: Secondary | ICD-10-CM

## 2015-06-04 DIAGNOSIS — R933 Abnormal findings on diagnostic imaging of other parts of digestive tract: Secondary | ICD-10-CM | POA: Insufficient documentation

## 2015-06-04 DIAGNOSIS — R1031 Right lower quadrant pain: Secondary | ICD-10-CM | POA: Insufficient documentation

## 2015-06-04 DIAGNOSIS — Z716 Tobacco abuse counseling: Secondary | ICD-10-CM | POA: Diagnosis not present

## 2015-06-04 DIAGNOSIS — R1030 Lower abdominal pain, unspecified: Secondary | ICD-10-CM

## 2015-06-04 MED ORDER — IOHEXOL 300 MG/ML  SOLN
100.0000 mL | Freq: Once | INTRAMUSCULAR | Status: AC | PRN
  Administered 2015-06-04: 100 mL via INTRAVENOUS

## 2015-06-04 MED ORDER — VARENICLINE TARTRATE 0.5 MG X 11 & 1 MG X 42 PO MISC: ORAL | Status: DC

## 2015-06-04 NOTE — Progress Notes (Signed)
BP 118/82 mmHg  Pulse 69  Temp(Src) 98 F (36.7 C) (Oral)  Ht 6' (1.829 m)  Wt 192 lb 3.2 oz (87.181 kg)  BMI 26.06 kg/m2   Subjective:    Patient ID: Jeffrey Logan, male    DOB: 1955-09-05, 59 y.o.   MRN: [id number]  HPI: Jeffrey Logan is a 60 y.o. male presenting on 06/04/2015 for Followup diverticulitis and Prescription for Chantix   HPI Abdominal pain Patient has lower abdominal pain on the right and left lower quadrants. His abdominal pain started her days ago and has been worsening over the past few days he saw Dr. Livia Snellen from my colleagues 2 days ago and was started on treatment for diverticulitis. He started the treatment of Cipro Flagyl on that day. He comes in today because he feels like the abdominal pain getting worse and his diarrhea although improved yesterday is much worse again today. He denies any blood in his stool. He also had a little bit of nausea this morning but no vomiting. He has never had abdominal pain like this previously. He has had lower abdominal surgery and bilateral inguinal hernia repairs. He has not been eating but does have sufficient fluid intake.  Relevant past medical, surgical, family and social history reviewed and updated as indicated. Interim medical history since our last visit reviewed. Allergies and medications reviewed and updated.  Review of Systems  Constitutional: Negative for fever.  HENT: Negative for ear discharge and ear pain.   Eyes: Negative for discharge and visual disturbance.  Respiratory: Negative for shortness of breath and wheezing.   Cardiovascular: Negative for chest pain and leg swelling.  Gastrointestinal: Positive for nausea, abdominal pain and diarrhea. Negative for vomiting, constipation and blood in stool.  Genitourinary: Negative for difficulty urinating.  Musculoskeletal: Negative for back pain and gait problem.  Skin: Negative for rash.  Neurological: Negative for dizziness, syncope, light-headedness and headaches.    All other systems reviewed and are negative.   Per HPI unless specifically indicated above     Medication List       This list is accurate as of: 06/04/15  8:35 AM.  Always use your most recent med list.               aspirin 81 MG tablet  Take 81 mg by mouth daily.     ciprofloxacin 500 MG tablet  Commonly known as:  CIPRO  Take 1 tablet (500 mg total) by mouth 2 (two) times daily.     diclofenac 75 MG EC tablet  Commonly known as:  VOLTAREN  Take 75 mg by mouth 2 (two) times daily.     metroNIDAZOLE 500 MG tablet  Commonly known as:  FLAGYL  Take 1 tablet (500 mg total) by mouth 3 (three) times daily.     NORVASC 10 MG tablet  Generic drug:  amLODipine  Take 10 mg by mouth daily.     omeprazole 40 MG capsule  Commonly known as:  PRILOSEC  Take 40 mg by mouth daily.     ondansetron 8 MG disintegrating tablet  Commonly known as:  ZOFRAN-ODT  Take 1 tablet (8 mg total) by mouth every 6 (six) hours as needed for nausea or vomiting.     testosterone cypionate 100 MG/ML injection  Commonly known as:  DEPOTESTOTERONE CYPIONATE  Inject into the muscle every 14 (fourteen) days. For IM use only     traMADol 50 MG tablet  Commonly known as:  ULTRAM  Take by  mouth daily.     varenicline 0.5 MG X 11 & 1 MG X 42 tablet  Commonly known as:  CHANTIX STARTING MONTH PAK  Take one 0.5 mg tablet by mouth daily for 3 days, then one 0.5 mg tablet twice daily for 4 days, then one 1 mg tablet twice daily.           Objective:    BP 118/82 mmHg  Pulse 69  Temp(Src) 98 F (36.7 C) (Oral)  Ht 6' (1.829 m)  Wt 192 lb 3.2 oz (87.181 kg)  BMI 26.06 kg/m2  Wt Readings from Last 3 Encounters:  06/04/15 192 lb 3.2 oz (87.181 kg)  06/02/15 192 lb 6.4 oz (87.272 kg)  04/22/15 191 lb (86.637 kg)    Physical Exam  Constitutional: He is oriented to person, place, and time. He appears well-developed and well-nourished. No distress.  Eyes: Conjunctivae and EOM are normal. Right  eye exhibits no discharge. No scleral icterus.  Cardiovascular: Normal rate, regular rhythm, normal heart sounds and intact distal pulses.   No murmur heard. Pulmonary/Chest: Effort normal and breath sounds normal. No respiratory distress. He has no wheezes.  Abdominal: Soft. Bowel sounds are normal. He exhibits no distension. There is tenderness in the right lower quadrant, suprapubic area and left lower quadrant. There is rebound. There is no rigidity, no guarding and no CVA tenderness.  Musculoskeletal: Normal range of motion. He exhibits no edema.  Neurological: He is alert and oriented to person, place, and time. Coordination normal.  Skin: Skin is warm and dry. No rash noted. He is not diaphoretic.  Psychiatric: He has a normal mood and affect. His behavior is normal.  Vitals reviewed.   Results for orders placed or performed in visit on 06/02/15  CBC with Differential/Platelet  Result Value Ref Range   WBC 7.8 3.4 - 10.8 x10E3/uL   RBC 4.26 4.14 - 5.80 x10E6/uL   Hemoglobin 13.5 12.6 - 17.7 g/dL   Hematocrit 39.8 37.5 - 51.0 %   MCV 93 79 - 97 fL   MCH 31.7 26.6 - 33.0 pg   MCHC 33.9 31.5 - 35.7 g/dL   RDW 14.4 12.3 - 15.4 %   Platelets 147 (L) 150 - 379 x10E3/uL   Neutrophils 54 %   Lymphs 32 %   Monocytes 13 %   Eos 1 %   Basos 0 %   Neutrophils Absolute 4.3 1.4 - 7.0 x10E3/uL   Lymphocytes Absolute 2.5 0.7 - 3.1 x10E3/uL   Monocytes Absolute 1.0 (H) 0.1 - 0.9 x10E3/uL   EOS (ABSOLUTE) 0.0 0.0 - 0.4 x10E3/uL   Basophils Absolute 0.0 0.0 - 0.2 x10E3/uL   Immature Granulocytes 0 %   Immature Grans (Abs) 0.0 0.0 - 0.1 x10E3/uL  CMP14+EGFR  Result Value Ref Range   Glucose 83 65 - 99 mg/dL   BUN 14 6 - 24 mg/dL   Creatinine, Ser 1.15 0.76 - 1.27 mg/dL   GFR calc non Af Amer 70 >59 mL/min/1.73   GFR calc Af Amer 81 >59 mL/min/1.73   BUN/Creatinine Ratio 12 9 - 20   Sodium 138 136 - 144 mmol/L   Potassium 4.2 3.5 - 5.2 mmol/L   Chloride 98 97 - 106 mmol/L   CO2 26 18  - 29 mmol/L   Calcium 8.7 8.7 - 10.2 mg/dL   Total Protein 6.3 6.0 - 8.5 g/dL   Albumin 3.6 3.5 - 5.5 g/dL   Globulin, Total 2.7 1.5 - 4.5 g/dL   Albumin/Globulin  Ratio 1.3 1.1 - 2.5   Bilirubin Total 0.7 0.0 - 1.2 mg/dL   Alkaline Phosphatase 89 39 - 117 IU/L   AST 23 0 - 40 IU/L   ALT 21 0 - 44 IU/L  Lipase  Result Value Ref Range   Lipase 35 0 - 59 U/L      Assessment & Plan:   Problem List Items Addressed This Visit    None    Visit Diagnoses    Encounter for smoking cessation counseling    -  Primary    Relevant Medications    varenicline (CHANTIX STARTING MONTH PAK) 0.5 MG X 11 & 1 MG X 42 tablet    Lower abdominal pain        Patient has signs of peritonitis. We will continue antibiotics but also will get a CT abdomen and pelvis stat to rule out appendicitis    Relevant Orders    CT Abdomen Pelvis W Contrast        Follow up plan: Return in about 1 week (around 06/11/2015), or if symptoms worsen or fail to improve, for f/u abd pain.  Caryl Pina, MD [facility] 06/04/2015, 8:35 AM

## 2015-06-10 ENCOUNTER — Ambulatory Visit: Payer: Worker's Compensation | Admitting: Physical Therapy

## 2015-06-10 DIAGNOSIS — M25611 Stiffness of right shoulder, not elsewhere classified: Secondary | ICD-10-CM | POA: Insufficient documentation

## 2015-06-10 DIAGNOSIS — M25511 Pain in right shoulder: Secondary | ICD-10-CM | POA: Insufficient documentation

## 2015-06-10 DIAGNOSIS — M6281 Muscle weakness (generalized): Secondary | ICD-10-CM | POA: Insufficient documentation

## 2015-06-10 NOTE — Therapy (Signed)
[facility] [address] [place], [place], [address] Phone: [phone]   Fax:  [phone]  Patient Details  Name: Jeffrey Logan MRN: [id number] Date of Birth: 01/12/1956 Referring Provider:  Dettinger, Elige RadonJoshua A, MD  Encounter Date: 06/10/2015   Patient arrived for therapy and reported he had a cortisone shot late Monday. Advised patient that therapy should be postponed for 72 hours after cortisone injections. Patient to return 06/12/15. No charge.   Solon PalmJulie Emmit Oriley PT  06/10/2015, 8:24 AM  [facility] [address] [place], [place], [address] Phone: [phone]   Fax:  [phone]

## 2015-06-12 ENCOUNTER — Encounter: Payer: Self-pay | Admitting: Physical Therapy

## 2015-06-15 ENCOUNTER — Ambulatory Visit: Payer: Worker's Compensation | Attending: Orthopaedic Surgery | Admitting: Physical Therapy

## 2015-06-15 DIAGNOSIS — M25611 Stiffness of right shoulder, not elsewhere classified: Secondary | ICD-10-CM | POA: Diagnosis present

## 2015-06-15 DIAGNOSIS — M25511 Pain in right shoulder: Secondary | ICD-10-CM

## 2015-06-15 DIAGNOSIS — M6281 Muscle weakness (generalized): Secondary | ICD-10-CM

## 2015-06-15 NOTE — Therapy (Signed)
[facility] [address] [place], [place], [address] Phone: [phone]   Fax:  [phone]  Physical Therapy Treatment  Patient Details  Name: Jeffrey Logan MRN: [id number] Date of Birth: [birth year] Referring Provider: Elsie Lincoln  Encounter Date: 06/15/2015      PT End of Session - 06/15/15 0903    Visit Number 42   Number of Visits 60   Date for PT Re-Evaluation 07/13/15   PT Start Time 0903   PT Stop Time 0946   PT Time Calculation (min) 43 min   Activity Tolerance Patient tolerated treatment well;Patient limited by pain   Behavior During Therapy [facility] for tasks assessed/performed      Past Medical History  Diagnosis Date  . Hypertension   . GERD (gastroesophageal reflux disease)   . Sleep apnea 2012    wears a cpap at night    Past Surgical History  Procedure Laterality Date  . Rotator cuff repair  12/25/14    right shoulder  . Lumbar disc surgery    . Lumbar fusion      L5-S1  . Back surgery      screw placement  . Rib fracture surgery  2002    removal of left 9th rib    There were no vitals filed for this visit.  Visit Diagnosis:  Right shoulder pain  Muscle weakness of right upper extremity      Subjective Assessment - 06/15/15 0904    Subjective Patient presents today post two cortisone injections into R shoulder.    Patient Stated Goals Return right shoulder to previous level of function.   Currently in Pain? Yes   Pain Score 4    Pain Location Shoulder   Pain Orientation Right   Pain Descriptors / Indicators Sore   Pain Type Surgical pain   Pain Onset More than a month ago   Pain Frequency Intermittent   Aggravating Factors  raising arm overhead   Pain Relieving Factors rest            [facility] PT Assessment - 06/15/15 0001    Assessment   Medical Diagnosis Right shoulder RCR.   Referring Provider Elsie Lincoln   Onset Date/Surgical Date 12/25/14   Next MD Visit 07/06/15   Strength    Overall Strength Deficits;Within functional limits for tasks performed   Strength Assessment Site Shoulder   Right/Left Shoulder Right   Right Shoulder Flexion 3+/5   Right Shoulder Extension 4+/5   Right Shoulder ABduction 3+/5   Right Shoulder Internal Rotation 4+/5   Right Shoulder External Rotation 4+/5                     [facility] Adult PT Treatment/Exercise - 06/15/15 0001    Shoulder Exercises: Prone   Retraction Strengthening;Right;Weights;20 reps   Retraction Weight (lbs) 6   Extension Strengthening;Right;Weights;20 reps   Extension Weight (lbs) 3   Horizontal ABduction 1 Weights;Strengthening;Right;20 reps   Horizontal ABduction 1 Weight (lbs) 3   Shoulder Exercises: Sidelying   External Rotation Strengthening;Right;20 reps   External Rotation Weight (lbs) 2   ABduction Strengthening;Right;20 reps;Weights  empty can   ABduction Weight (lbs) 2   Shoulder Exercises: Standing   Protraction Strengthening;Right  30 into red therabal   Flexion Strengthening;Right;Weights;20 reps  3x10   Shoulder Flexion Weight (lbs) 2   ABduction Strengthening;Right;Weights  3x10   Shoulder ABduction Weight (lbs) 2   Retraction Strengthening;20 reps;Weights  with ER   Retraction Weight (lbs)  2   Other Standing Exercises scaption 2# x 20   Shoulder Exercises: Pulleys   Flexion 3 minutes   Shoulder Exercises: ROM/Strengthening   UBE (Upper Arm Bike) 60 RPM x 6 min                     PT Long Term Goals - 06/15/15 1103    PT LONG TERM GOAL #1   Title Ind with a HEP.   Time 6   Period Weeks   PT LONG TERM GOAL #2   Title Active right shoulder flexion to 155 degrees+.   Time 6   Period Weeks   Status Achieved   PT LONG TERM GOAL #3   Title Active ER to 80 degrees.   Time 6   Period Weeks   Status Achieved   PT LONG TERM GOAL #4   Title Patient reach behind back to L2 with right hand.   Time 6   Period Weeks   Status Achieved   PT LONG TERM GOAL #5    Title Right shoulder strength= 5/5.   Time 6   Period Weeks   Status On-going   PT LONG TERM GOAL #6   Title Patient able to perform ADL's with right shoulder pain not > 3/10.   Time 6   Period Weeks   Status Achieved               Plan - 06/15/15 0959    Clinical Impression Statement Patient presents today after having 3 weeks off PT per physician request. He has a new RX for 2-3/wk for 4 wks to continue strengthening. His strength has decreased since his last visit although he is able to tolerate weight with therex that does not align with his MMT. He continues to c/o popping with pain in the right shoulder. An MRI will be done in the near future to ensure RC is intact.   Pt will benefit from skilled therapeutic intervention in order to improve on the following deficits Pain;Decreased activity tolerance;Decreased range of motion;Decreased strength   Rehab Potential Excellent   PT Frequency 3x / week   PT Duration 6 weeks   PT Treatment/Interventions ADLs/Self Care Home Management;Cryotherapy;Electrical Stimulation;Moist Heat;Ultrasound;Therapeutic activities;Therapeutic exercise;Neuromuscular re-education;Patient/family education;Manual techniques;Passive range of motion;Iontophoresis 4mg /ml Dexamethasone   PT Next Visit Plan continue with strengthening R shoulder; add ionto.   Consulted and Agree with Plan of Care Patient        Problem List Patient Active Problem List   Diagnosis Date Noted  . Low testosterone 04/22/2015  . GERD (gastroesophageal reflux disease) 04/22/2015  . Barrett's esophagus 04/22/2015  . Essential hypertension, benign 04/22/2015    Jeffrey PalmJulie Walden Logan PT  06/15/2015, 11:04 AM  [facility] [address] [place], [place], [address] Phone: [phone]   Fax:  [phone]  Name: Jeffrey Logan MRN: [id number] Date of Birth: 12/24/1955

## 2015-06-17 ENCOUNTER — Ambulatory Visit: Payer: Worker's Compensation | Admitting: *Deleted

## 2015-06-17 ENCOUNTER — Encounter: Payer: Self-pay | Admitting: *Deleted

## 2015-06-17 DIAGNOSIS — M6281 Muscle weakness (generalized): Secondary | ICD-10-CM

## 2015-06-17 DIAGNOSIS — M25611 Stiffness of right shoulder, not elsewhere classified: Secondary | ICD-10-CM

## 2015-06-17 DIAGNOSIS — M25511 Pain in right shoulder: Secondary | ICD-10-CM | POA: Diagnosis not present

## 2015-06-17 NOTE — Therapy (Signed)
[facility] [address] [place], [place], [address] Phone: [phone]   Fax:  [phone]  Physical Therapy Treatment  Patient Details  Name: Jeffrey Logan MRN: [id number] Date of Birth: Jan 02, 1956 Referring Provider: Elsie Lincoln  Encounter Date: 06/17/2015      PT End of Session - 06/17/15 1035    Visit Number 43   Number of Visits 60   Date for PT Re-Evaluation 07/13/15   PT Start Time 0906   PT Stop Time 0953   PT Time Calculation (min) 47 min      Past Medical History  Diagnosis Date  . Hypertension   . GERD (gastroesophageal reflux disease)   . Sleep apnea 2012    wears a cpap at night    Past Surgical History  Procedure Laterality Date  . Rotator cuff repair  12/25/14    right shoulder  . Lumbar disc surgery    . Lumbar fusion      L5-S1  . Back surgery      screw placement  . Rib fracture surgery  2002    removal of left 9th rib    There were no vitals filed for this visit.  Visit Diagnosis:  Right shoulder pain  Muscle weakness of right upper extremity  Shoulder stiffness, right      Subjective Assessment - 06/17/15 0912    Subjective RT shldr is doing ok    Limitations Lifting   Patient Stated Goals Return right shoulder to previous level of function.   Currently in Pain? Yes   Pain Score 3    Pain Location Shoulder   Pain Orientation Right   Pain Descriptors / Indicators Sore   Pain Type Surgical pain   Pain Onset More than a month ago   Pain Frequency Intermittent   Aggravating Factors  raising arm    Pain Relieving Factors rest                         [facility] Adult PT Treatment/Exercise - 06/17/15 0001    Shoulder Exercises: Prone   Retraction Strengthening;Right;Weights  ROWS  5x10   Retraction Weight (lbs) 6   Extension Strengthening;Right;Weights   Horizontal ABduction 1 Weights;Strengthening;Right  5x 10   Horizontal ABduction 1 Weight (lbs) 2   Shoulder  Exercises: Standing   Flexion Strengthening;Weights  5x 10    Shoulder Flexion Weight (lbs) 2   ABduction Strengthening;Right;Weights  2# 5x 10   Shoulder ABduction Weight (lbs) 2   Other Standing Exercises overhead punches 2# 3x10   Shoulder Exercises: ROM/Strengthening   UBE (Upper Arm Bike) 60 RPM x 6 min   Iontophoresis   Type of Iontophoresis Dexamethasone   Location R ant shoulder   Dose 2 ml 5 of 6 reps   Time 8                                                  Bicep curl 4# 5x10,   Ball on wall serratus punch                 PT Long Term Goals - 06/15/15 1103    PT LONG TERM GOAL #1   Title Ind with a HEP.   Time 6   Period Weeks   PT LONG TERM GOAL #2  Title Active right shoulder flexion to 155 degrees+.   Time 6   Period Weeks   Status Achieved   PT LONG TERM GOAL #3   Title Active ER to 80 degrees.   Time 6   Period Weeks   Status Achieved   PT LONG TERM GOAL #4   Title Patient reach behind back to L2 with right hand.   Time 6   Period Weeks   Status Achieved   PT LONG TERM GOAL #5   Title Right shoulder strength= 5/5.   Time 6   Period Weeks   Status On-going   PT LONG TERM GOAL #6   Title Patient able to perform ADL's with right shoulder pain not > 3/10.   Time 6   Period Weeks   Status Achieved               Plan - 06/17/15 1036    Clinical Impression Statement Pt did fairly well with Rx today for RT shldr, but continues to have popping and pain at ACJ., Added Ionto patch to RX 1 of 6 today   Pt will benefit from skilled therapeutic intervention in order to improve on the following deficits Pain;Decreased activity tolerance;Decreased range of motion;Decreased strength   Rehab Potential Excellent   Clinical Impairments Affecting Rehab Potential surgury 12/25/14 current 19 weeks 05/08/15   PT Frequency 3x / week   PT Duration 6 weeks   PT Treatment/Interventions ADLs/Self Care Home Management;Cryotherapy;Electrical  Stimulation;Moist Heat;Ultrasound;Therapeutic activities;Therapeutic exercise;Neuromuscular re-education;Patient/family education;Manual techniques;Passive range of motion;Iontophoresis 4mg /ml Dexamethasone   PT Next Visit Plan continue with strengthening R shoulder; added  ionto.  1/6 Rxs   Consulted and Agree with Plan of Care Patient        Problem List Patient Active Problem List   Diagnosis Date Noted  . Low testosterone 04/22/2015  . GERD (gastroesophageal reflux disease) 04/22/2015  . Barrett's esophagus 04/22/2015  . Essential hypertension, benign 04/22/2015    Trenell Concannon,CHRIS, PTA 06/17/2015, 10:44 AM  [facility] [address] [place], [place], [address] Phone: [phone]   Fax:  [phone]  Name: Jeffrey Logan MRN: [id number] Date of Birth: 12/18/1955

## 2015-06-19 ENCOUNTER — Ambulatory Visit: Payer: Worker's Compensation | Admitting: *Deleted

## 2015-06-19 ENCOUNTER — Encounter: Payer: Self-pay | Admitting: *Deleted

## 2015-06-19 DIAGNOSIS — M6281 Muscle weakness (generalized): Secondary | ICD-10-CM

## 2015-06-19 DIAGNOSIS — M25611 Stiffness of right shoulder, not elsewhere classified: Secondary | ICD-10-CM

## 2015-06-19 DIAGNOSIS — M25511 Pain in right shoulder: Secondary | ICD-10-CM | POA: Diagnosis not present

## 2015-06-19 NOTE — Therapy (Signed)
[facility] [address] [place], [place], [address] Phone: [phone]   Fax:  [phone]  Physical Therapy Treatment  Patient Details  Name: Jeffrey Brookingllen Kleinschmidt MRN: [id number] Date of Birth: 03/06/1956 Referring Provider: Elsie LincolnPeter Daldorf  Encounter Date: 06/19/2015      PT End of Session - 06/19/15 1002    Visit Number 44   Number of Visits 60   PT Start Time 0948   PT Stop Time 1040   PT Time Calculation (min) 52 min      Past Medical History  Diagnosis Date  . Hypertension   . GERD (gastroesophageal reflux disease)   . Sleep apnea 2012    wears a cpap at night    Past Surgical History  Procedure Laterality Date  . Rotator cuff repair  12/25/14    right shoulder  . Lumbar disc surgery    . Lumbar fusion      L5-S1  . Back surgery      screw placement  . Rib fracture surgery  2002    removal of left 9th rib    There were no vitals filed for this visit.  Visit Diagnosis:  Right shoulder pain  Muscle weakness of right upper extremity  Shoulder stiffness, right      Subjective Assessment - 06/19/15 0958    Subjective RT shldr is doing ok    Limitations Lifting   Patient Stated Goals Return right shoulder to previous level of function.   Pain Score 3    Pain Location Shoulder   Pain Orientation Right   Pain Descriptors / Indicators Sore   Pain Type Surgical pain   Pain Frequency Intermittent   Aggravating Factors  raising arm                         [facility] Adult PT Treatment/Exercise - 06/19/15 0001    Exercises   Exercises Shoulder;Elbow   Elbow Exercises   Elbow Flexion Strengthening  6# 5x 10   Shoulder Exercises: Prone   Retraction Strengthening;Right;Weights   Retraction Weight (lbs) 6   Extension Strengthening;Right;Weights  5x10   Extension Weight (lbs) 6   Horizontal ABduction 1 Strengthening;Right  3x10   Horizontal ABduction 1 Weight (lbs) 2   Shoulder Exercises: Sidelying   External Rotation Strengthening;Right;Weights  3x10   External Rotation Weight (lbs) 3   Shoulder Exercises: Standing   Flexion Strengthening;Weights  5x 10    Shoulder Flexion Weight (lbs) 2   ABduction Strengthening;Right;Weights  2# 3x 10   only 3 sets due to pain   Shoulder ABduction Weight (lbs) 2   Other Standing Exercises overhead punches 2# 3x10   Shoulder Exercises: Therapy Ball   Other Therapy Ball Exercises ball on wall serratus punch   Shoulder Exercises: ROM/Strengthening   UBE (Upper Arm Bike) 60 RPM x 6 min   Iontophoresis   Type of Iontophoresis Dexamethasone   Location R ant shoulder   Dose 2 ml 5 of 6 reps   Time 8                     PT Long Term Goals - 06/15/15 1103    PT LONG TERM GOAL #1   Title Ind with a HEP.   Time 6   Period Weeks   PT LONG TERM GOAL #2   Title Active right shoulder flexion to 155 degrees+.   Time 6   Period Weeks   Status  Achieved   PT LONG TERM GOAL #3   Title Active ER to 80 degrees.   Time 6   Period Weeks   Status Achieved   PT LONG TERM GOAL #4   Title Patient reach behind back to L2 with right hand.   Time 6   Period Weeks   Status Achieved   PT LONG TERM GOAL #5   Title Right shoulder strength= 5/5.   Time 6   Period Weeks   Status On-going   PT LONG TERM GOAL #6   Title Patient able to perform ADL's with right shoulder pain not > 3/10.   Time 6   Period Weeks   Status Achieved               Plan - 06/19/15 1239    Clinical Impression Statement Pt did ok with RX today, but was having more pain than last time. He wasnt able to perform as many reps with standing abduction due to pain and popping in RT ACJ. Goals are on-going   Pt will benefit from skilled therapeutic intervention in order to improve on the following deficits Pain;Decreased activity tolerance;Decreased range of motion;Decreased strength   Clinical Impairments Affecting Rehab Potential surgury 12/25/14 current 19 weeks  05/08/15   PT Frequency 3x / week   PT Duration 6 weeks   PT Next Visit Plan continue with strengthening R shoulder; added  ionto.  2/6 Rxs   Consulted and Agree with Plan of Care Patient        Problem List Patient Active Problem List   Diagnosis Date Noted  . Low testosterone 04/22/2015  . GERD (gastroesophageal reflux disease) 04/22/2015  . Barrett's esophagus 04/22/2015  . Essential hypertension, benign 04/22/2015    RAMSEUR,CHRIS, PTA 06/19/2015, 12:48 PM  [facility] [address] [place], [place], [address] Phone: [phone]   Fax:  [phone]  Name: Jeffrey Logan MRN: [id number] Date of Birth: 16-Mar-1956

## 2015-06-23 ENCOUNTER — Ambulatory Visit: Payer: Worker's Compensation | Attending: Orthopaedic Surgery | Admitting: *Deleted

## 2015-06-23 ENCOUNTER — Encounter: Payer: Self-pay | Admitting: *Deleted

## 2015-06-23 DIAGNOSIS — M6281 Muscle weakness (generalized): Secondary | ICD-10-CM

## 2015-06-23 DIAGNOSIS — M25511 Pain in right shoulder: Secondary | ICD-10-CM | POA: Diagnosis present

## 2015-06-23 DIAGNOSIS — M25611 Stiffness of right shoulder, not elsewhere classified: Secondary | ICD-10-CM | POA: Diagnosis present

## 2015-06-23 NOTE — Therapy (Signed)
[facility] [address] [place], [place], [address] Phone: [phone]   Fax:  [phone]  Physical Therapy Treatment  Patient Details  Name: Jeffrey Logan MRN: [id number] Date of Birth: 16-Mar-1956 Referring Provider: Elsie Lincoln  Encounter Date: 06/23/2015      PT End of Session - 06/23/15 0917    Visit Number 45   Number of Visits 60   Date for PT Re-Evaluation 07/13/15   PT Start Time 0900   PT Stop Time 0951   PT Time Calculation (min) 51 min      Past Medical History  Diagnosis Date  . Hypertension   . GERD (gastroesophageal reflux disease)   . Sleep apnea 2012    wears a cpap at night    Past Surgical History  Procedure Laterality Date  . Rotator cuff repair  12/25/14    right shoulder  . Lumbar disc surgery    . Lumbar fusion      L5-S1  . Back surgery      screw placement  . Rib fracture surgery  2002    removal of left 9th rib    There were no vitals filed for this visit.  Visit Diagnosis:  Right shoulder pain  Muscle weakness of right upper extremity  Shoulder stiffness, right      Subjective Assessment - 06/23/15 0915    Subjective RT shldr is doing ok. It is still very sore in ACJ   Limitations Lifting   Patient Stated Goals Return right shoulder to previous level of function.   Currently in Pain? Yes   Pain Score 4    Pain Location Shoulder   Pain Orientation Right   Pain Descriptors / Indicators Aching;Sore   Pain Type Surgical pain   Pain Onset More than a month ago   Pain Frequency Intermittent   Aggravating Factors  Raising arm   Pain Relieving Factors rest                         [facility] Adult PT Treatment/Exercise - 06/23/15 0001    Exercises   Exercises Elbow;Shoulder   Elbow Exercises   Elbow Flexion Strengthening  6# 5x 10   Shoulder Exercises: Supine   Protraction Strengthening;Right;Weights  3x10   Protraction Weight (lbs) 6   Shoulder Exercises: Prone    Retraction Strengthening;Right;Weights   Retraction Weight (lbs) 6   Extension Strengthening;Right;Weights  5x10   Extension Weight (lbs) 6   Horizontal ABduction 1 Strengthening;Right  3x10   Horizontal ABduction 1 Weight (lbs) 3   Shoulder Exercises: Sidelying   External Rotation Strengthening;Right;Weights  3x10   External Rotation Weight (lbs) 6   Shoulder Exercises: Standing   Flexion Strengthening;Weights  5x 10    Shoulder Flexion Weight (lbs) 3   ABduction Strengthening;Right;Weights  2# 3x 10   only 3 sets due to pain   Shoulder ABduction Weight (lbs) 3   Other Standing Exercises overhead punches 3# 4x10   Shoulder Exercises: Therapy Ball   Other Therapy Ball Exercises ball on wall serratus punch   Shoulder Exercises: ROM/Strengthening   UBE (Upper Arm Bike) 60 RPM x 8 min   Iontophoresis   Type of Iontophoresis Dexamethasone   Location R ant shoulder   Dose 2 ml 5 of 6 reps   Time 8                     PT Long Term Goals - 06/15/15  1103    PT LONG TERM GOAL #1   Title Ind with a HEP.   Time 6   Period Weeks   PT LONG TERM GOAL #2   Title Active right shoulder flexion to 155 degrees+.   Time 6   Period Weeks   Status Achieved   PT LONG TERM GOAL #3   Title Active ER to 80 degrees.   Time 6   Period Weeks   Status Achieved   PT LONG TERM GOAL #4   Title Patient reach behind back to L2 with right hand.   Time 6   Period Weeks   Status Achieved   PT LONG TERM GOAL #5   Title Right shoulder strength= 5/5.   Time 6   Period Weeks   Status On-going   PT LONG TERM GOAL #6   Title Patient able to perform ADL's with right shoulder pain not > 3/10.   Time 6   Period Weeks   Status Achieved               Plan - 06/23/15 0946    Clinical Impression Statement Pt did great again with exs, but continues to be limited in some lifting act.'s due to RT ACJ pain. He has been able to progress in resistance with some Exs, but not with  others. Normal  ionto response. Still unable to meet 2 LTGs due to pain and weakness.   Pt will benefit from skilled therapeutic intervention in order to improve on the following deficits Pain;Decreased activity tolerance;Decreased range of motion;Decreased strength   Rehab Potential Excellent   Clinical Impairments Affecting Rehab Potential surgury 12/25/14 current 19 weeks 05/08/15   PT Frequency 3x / week   PT Duration 6 weeks   PT Next Visit Plan continue with strengthening R shoulder; added  ionto.  3/6 Rxs   Consulted and Agree with Plan of Care Patient        Problem List Patient Active Problem List   Diagnosis Date Noted  . Low testosterone 04/22/2015  . GERD (gastroesophageal reflux disease) 04/22/2015  . Barrett's esophagus 04/22/2015  . Essential hypertension, benign 04/22/2015    RAMSEUR,CHRIS, PTA 06/23/2015, 10:09 AM  [facility] [address] [place], [place], [address] Phone: [phone]   Fax:  [phone]  Name: Jeffrey Logan MRN: [id number] Date of Birth: 01/21/1956

## 2015-06-29 ENCOUNTER — Ambulatory Visit: Payer: Worker's Compensation | Admitting: Physical Therapy

## 2015-06-29 DIAGNOSIS — M25511 Pain in right shoulder: Secondary | ICD-10-CM | POA: Diagnosis not present

## 2015-06-29 DIAGNOSIS — M6281 Muscle weakness (generalized): Secondary | ICD-10-CM

## 2015-06-29 NOTE — Therapy (Signed)
[facility] [address] [place], [place], [address] Phone: [phone]   Fax:  [phone]  Physical Therapy Treatment  Patient Details  Name: Jeffrey Logan MRN: [id number] Date of Birth: 05/04/1956 Referring Provider: Elsie LincolnPeter Daldorf  Encounter Date: 06/29/2015      PT End of Session - 06/29/15 1115    Visit Number 46   Number of Visits 60   Date for PT Re-Evaluation 07/13/15   PT Start Time 1115   PT Stop Time 1205   PT Time Calculation (min) 50 min   Activity Tolerance Patient tolerated treatment well   Behavior [facility] for [facility] assessed/performed      Past Medical History  Diagnosis Date  . Hypertension   . GERD (gastroesophageal reflux disease)   . Sleep apnea 2012    wears a cpap at night    Past Surgical History  Procedure Laterality Date  . Rotator cuff repair  12/25/14    right shoulder  . Lumbar disc surgery    . Lumbar fusion      L5-S1  . Back surgery      screw placement  . Rib fracture surgery  2002    removal of left 9th rib    There were no vitals filed for this visit.  Visit Diagnosis:  Right shoulder pain  Muscle weakness of right upper extremity      Subjective Assessment - 06/29/15 1117    Subjective It was a little sore this weekend in anterior shoulder.   Patient Stated Goals Return right shoulder to previous level of function.   Currently in Pain? Yes   Pain Score 3    Pain Location Shoulder   Pain Orientation Right   Pain Descriptors / Indicators Aching;Sore   Pain Type Surgical pain   Pain Onset More than a month ago   Pain Frequency Intermittent   Aggravating Factors  raising arm   Pain Relieving Factors rest                         [facility] Adult PT Treatment/Exercise - 06/29/15 0001    Shoulder Exercises: Supine   Protraction Strengthening;Right;Weights  3x10   Protraction Weight (lbs) 6   Shoulder Exercises: Prone   Retraction  Strengthening;Right;Weights   Retraction Weight (lbs) 6   Extension Strengthening;Right;Weights  5x10   Extension Weight (lbs) 6   Horizontal ABduction 1 Strengthening;Right  3x10   Horizontal ABduction 1 Weight (lbs) 3   Shoulder Exercises: Sidelying   External Rotation Strengthening;Right;Weights  3x10   External Rotation Weight (lbs) 6   Shoulder Exercises: Standing   External Rotation Strengthening;Right;20 reps  against wall with small blue ball   Flexion Strengthening;Weights  5x 10    Shoulder Flexion Weight (lbs) 3   ABduction Strengthening;Right  40 reps   Shoulder ABduction Weight (lbs) 3   Other Standing Exercises overhead punches 3# 5x10   Shoulder Exercises: ROM/Strengthening   UBE (Upper Arm Bike) 60 RPM x 8 min   Iontophoresis   Type of Iontophoresis Dexamethasone   Location R ant shoulder   Dose 2 ml 6 of 6 reps   Time 12  first patch did not have good connection; 2nd applied                     PT Long Term Goals - 06/15/15 1103    PT LONG TERM GOAL #1   Title Ind with a HEP.  Time 6   Period Weeks   PT LONG TERM GOAL #2   Title Active right shoulder flexion to 155 degrees+.   Time 6   Period Weeks   Status Achieved   PT LONG TERM GOAL #3   Title Active ER to 80 degrees.   Time 6   Period Weeks   Status Achieved   PT LONG TERM GOAL #4   Title Patient reach behind back to L2 with right hand.   Time 6   Period Weeks   Status Achieved   PT LONG TERM GOAL #5   Title Right shoulder strength= 5/5.   Time 6   Period Weeks   Status On-going   PT LONG TERM GOAL #6   Title Patient able to perform ADL's with right shoulder pain not > 3/10.   Time 6   Period Weeks   Status Achieved               Plan - 06/29/15 1207    Clinical Impression Statement Patient had some discomfort with rows today in anterior shoulder but he attributes it to trying to throw a ball to his dog this weekend. Otherwise, tolerated therex well with only  minor complaints with standing ABD.   PT Next Visit Plan continue with strengthening R shoulder. No more ionto. Patient has had 6/6.        Problem List Patient Active Problem List   Diagnosis Date Noted  . Low testosterone 04/22/2015  . GERD (gastroesophageal reflux disease) 04/22/2015  . Barrett's esophagus 04/22/2015  . Essential hypertension, benign 04/22/2015    Solon Palm PT  06/29/2015, 12:11 PM  [facility] [address] [place], [place], [address] Phone: [phone]   Fax:  [phone]  Name: Jeffrey Logan MRN: [id number] Date of Birth: 01/04/1956

## 2015-07-01 ENCOUNTER — Ambulatory Visit: Payer: Worker's Compensation | Admitting: Physical Therapy

## 2015-07-01 ENCOUNTER — Encounter: Payer: Self-pay | Admitting: Physical Therapy

## 2015-07-01 DIAGNOSIS — M25611 Stiffness of right shoulder, not elsewhere classified: Secondary | ICD-10-CM

## 2015-07-01 DIAGNOSIS — M25511 Pain in right shoulder: Secondary | ICD-10-CM

## 2015-07-01 DIAGNOSIS — M6281 Muscle weakness (generalized): Secondary | ICD-10-CM

## 2015-07-01 NOTE — Therapy (Signed)
[facility] [address] [place], [place], [address] Phone: [phone]   Fax:  [phone]  Physical Therapy Treatment  Patient Details  Name: Jeffrey Logan MRN: [id number] Date of Birth: 02-May-1956 Referring Provider: Elsie Lincoln  Encounter Date: 07/01/2015      PT End of Session - 07/01/15 0952    Visit Number 47   Number of Visits 60   Date for PT Re-Evaluation 07/13/15   PT Start Time 0948   PT Stop Time 1028   PT Time Calculation (min) 40 min   Activity Tolerance Patient tolerated treatment well   Behavior During Therapy [facility] for tasks assessed/performed      Past Medical History  Diagnosis Date  . Hypertension   . GERD (gastroesophageal reflux disease)   . Sleep apnea 2012    wears a cpap at night    Past Surgical History  Procedure Laterality Date  . Rotator cuff repair  12/25/14    right shoulder  . Lumbar disc surgery    . Lumbar fusion      L5-S1  . Back surgery      screw placement  . Rib fracture surgery  2002    removal of left 9th rib    There were no vitals filed for this visit.  Visit Diagnosis:  Right shoulder pain  Muscle weakness of right upper extremity  Shoulder stiffness, right      Subjective Assessment - 07/01/15 0952    Subjective Reports that R shoulder still sore.   Limitations Lifting   Patient Stated Goals Return right shoulder to previous level of function.   Currently in Pain? Yes   Pain Score 4    Pain Location Shoulder   Pain Orientation Right;Anterior   Pain Descriptors / Indicators Sore   Pain Type Surgical pain   Pain Onset More than a month ago            [facility] PT Assessment - 07/01/15 0001    Assessment   Medical Diagnosis Right shoulder RCR.   Onset Date/Surgical Date 12/25/14   Next MD Visit 07/06/15                     [facility] Adult PT Treatment/Exercise - 07/01/15 0001    Shoulder Exercises: Supine   Protraction Strengthening;Right;Weights   30 reps   Protraction Weight (lbs) 6   Shoulder Exercises: Prone   Retraction Strengthening;Right;Weights  3x10 reps   Theraband Level (Shoulder Retraction) --   Retraction Weight (lbs) 6   Extension Strengthening;Right;Weights  x50 reps   Extension Weight (lbs) 6   Horizontal ABduction 1 Strengthening;Right;Weights  x30 reps   Horizontal ABduction 1 Weight (lbs) 3   Shoulder Exercises: Sidelying   External Rotation Strengthening;Right;Weights  x30 reps   External Rotation Weight (lbs) 6   Shoulder Exercises: Standing   External Rotation Strengthening;Right;20 reps  ER with shoulder at 90 deg with small 2# ball   Flexion Strengthening;Weights  5x10 reps   Shoulder Flexion Weight (lbs) 3   ABduction Strengthening;Right  scaption x40 reps   Shoulder ABduction Weight (lbs) 3   Other Standing Exercises overhead punches 3# 5x10   Shoulder Exercises: ROM/Strengthening   UBE (Upper Arm Bike) 60 RPM x 8 min                     PT Long Term Goals - 06/15/15 1103    PT LONG TERM GOAL #1   Title Ind with  a HEP.   Time 6   Period Weeks   PT LONG TERM GOAL #2   Title Active right shoulder flexion to 155 degrees+.   Time 6   Period Weeks   Status Achieved   PT LONG TERM GOAL #3   Title Active ER to 80 degrees.   Time 6   Period Weeks   Status Achieved   PT LONG TERM GOAL #4   Title Patient reach behind back to L2 with right hand.   Time 6   Period Weeks   Status Achieved   PT LONG TERM GOAL #5   Title Right shoulder strength= 5/5.   Time 6   Period Weeks   Status On-going   PT LONG TERM GOAL #6   Title Patient able to perform ADL's with right shoulder pain not > 3/10.   Time 6   Period Weeks   Status Achieved               Plan - 07/01/15 1013    Clinical Impression Statement Patient tolerated today's treatment fairly well although he continues to have R anterior shoulder symptoms during exercises. Experienced R anterior shoulder popping with  R shoulder scaption, prone horizontal abduction and also experienced R anterior shoulder stinging with prone R shoulder rows per patient report. Tolerated shoulder flexion and ER at 90 deg at wall better today per patient report. No compensatory strategies detected today with any strengthening exercises. Experienced 4/10 R shoulder following today's treatment.   Pt will benefit from skilled therapeutic intervention in order to improve on the following deficits Pain;Decreased activity tolerance;Decreased range of motion;Decreased strength   Rehab Potential Excellent   Clinical Impairments Affecting Rehab Potential Surgery 12/25/2014   PT Frequency 3x / week   PT Duration 6 weeks   PT Treatment/Interventions ADLs/Self Care Home Management;Cryotherapy;Electrical Stimulation;Moist Heat;Ultrasound;Therapeutic activities;Therapeutic exercise;Neuromuscular re-education;Patient/family education;Manual techniques;Passive range of motion;Iontophoresis 4mg /ml Dexamethasone   PT Next Visit Plan Continue with R shoulder strengthening and prepare MD note next treatment.        Problem List Patient Active Problem List   Diagnosis Date Noted  . Low testosterone 04/22/2015  . GERD (gastroesophageal reflux disease) 04/22/2015  . Barrett's esophagus 04/22/2015  . Essential hypertension, benign 04/22/2015    Evelene CroonKelsey M Parsons, PTA 07/01/2015, 10:34 AM  [facility] [address] [place], [place], [address] Phone: [phone]   Fax:  [phone]  Name: Jeffrey Logan MRN: [id number] Date of Birth: 10/13/1955

## 2015-07-03 ENCOUNTER — Ambulatory Visit: Payer: Worker's Compensation | Attending: Orthopaedic Surgery | Admitting: Physical Therapy

## 2015-07-03 DIAGNOSIS — M25511 Pain in right shoulder: Secondary | ICD-10-CM

## 2015-07-03 DIAGNOSIS — M25611 Stiffness of right shoulder, not elsewhere classified: Secondary | ICD-10-CM

## 2015-07-03 DIAGNOSIS — M6281 Muscle weakness (generalized): Secondary | ICD-10-CM

## 2015-07-03 NOTE — Therapy (Signed)
[facility] [address] [place], [place], [address] Phone: [phone]   Fax:  [phone]  Physical Therapy Treatment  Patient Details  Name: Jeffrey Logan MRN: [id number] Date of Birth: 27-Apr-1956 Referring Provider: Elsie Lincoln  Encounter Date: 07/03/2015      PT End of Session - 07/03/15 1046    Visit Number 48   Number of Visits 60   Date for PT Re-Evaluation 07/13/15   PT Start Time 0946   PT Stop Time 1040   PT Time Calculation (min) 54 min   Activity Tolerance Patient tolerated treatment well   Behavior During Therapy [facility] for tasks assessed/performed      Past Medical History  Diagnosis Date  . Hypertension   . GERD (gastroesophageal reflux disease)   . Sleep apnea 2012    wears a cpap at night    Past Surgical History  Procedure Laterality Date  . Rotator cuff repair  12/25/14    right shoulder  . Lumbar disc surgery    . Lumbar fusion      L5-S1  . Back surgery      screw placement  . Rib fracture surgery  2002    removal of left 9th rib    There were no vitals filed for this visit.  Visit Diagnosis:  Right shoulder pain  Muscle weakness of right upper extremity  Shoulder stiffness, right      Subjective Assessment - 07/03/15 1004    Subjective Pain-level at a 5/10 this morning.   Limitations Lifting   Patient Stated Goals Return right shoulder to previous level of function.   Pain Score 5    Pain Location Shoulder   Pain Orientation Right;Anterior   Pain Descriptors / Indicators Sore   Pain Type Surgical pain   Pain Onset More than a month ago                         [facility] Adult PT Treatment/Exercise - 07/03/15 0001    Exercises   Exercises Shoulder;Elbow                     PT Long Term Goals - 07/03/15 1044    PT LONG TERM GOAL #1   Title Ind with a HEP.   Time 6   Period Weeks   Status Achieved   PT LONG TERM GOAL #2   Title Active right shoulder  flexion to 155 degrees+.   Time 6   Period Weeks   Status Achieved   PT LONG TERM GOAL #3   Title Active ER to 80 degrees.   Time 6   Period Weeks   Status Achieved   PT LONG TERM GOAL #4   Title Patient reach behind back to L2 with right hand.   Time 6   Period Weeks   Status Achieved   PT LONG TERM GOAL #5   Title Right shoulder strength= 5/5.   Time 6   Period Weeks   Status On-going   PT LONG TERM GOAL #6   Title Patient able to perform ADL's with right shoulder pain not > 3/10.   Time 6   Period Weeks   Status Achieved               Plan - 07/03/15 1042    Clinical Impression Statement Patient tolerated treament well today.  his pain-level today is a 5/10 and pain is reproduced with a throwing  motion ofhis right shoulder (ie:  pain when throwing frisbee to dog).  Right shoulder flexion motion is not equal to left at this point.   Rehab Potential Excellent   Clinical Impairments Affecting Rehab Potential Surgery 12/25/2014   PT Frequency 3x / week   PT Duration 6 weeks   PT Treatment/Interventions ADLs/Self Care Home Management;Cryotherapy;Electrical Stimulation;Moist Heat;Ultrasound;Therapeutic activities;Therapeutic exercise;Neuromuscular re-education;Patient/family education;Manual techniques;Passive range of motion;Iontophoresis 4mg /ml Dexamethasone   PT Next Visit Plan IASTM right anterior shoulder; Flexion stretching with scapular stabilization and rebounder pitching-type motion.        Problem List Patient Active Problem List   Diagnosis Date Noted  . Low testosterone 04/22/2015  . GERD (gastroesophageal reflux disease) 04/22/2015  . Barrett's esophagus 04/22/2015  . Essential hypertension, benign 04/22/2015   Treatment:  UBE at 60 PM's x 8 minutes; 3# Bicep curls and abduction 5 x 10; Full can with 6# 5 x 10; 6# military press 5 x 10; Prone rows 5 x 10; Prone extension with 6# 5 x 10; SDLY ER with 3# 5  X 10; Horiz abd with 3# 5 x 10; supine punches 5  x 10;  **Iontophoresis 4mg /ml to patient's affected right shoulder.  APPLEGATE, [place]HAD MPT 07/03/2015, 10:47 AM  [facility] [address] [place], [place], [address] Phone: [phone]   Fax:  [phone]  Name: Jeffrey Logan MRN: [id number] Date of Birth: 05/19/1956

## 2015-08-10 HISTORY — PX: SHOULDER SURGERY: SHX246

## 2015-08-19 ENCOUNTER — Other Ambulatory Visit: Payer: Self-pay

## 2015-08-19 MED ORDER — OMEPRAZOLE 40 MG PO CPDR: 40.0000 mg | DELAYED_RELEASE_CAPSULE | Freq: Every day | ORAL | Status: DC

## 2015-08-19 MED ORDER — DICLOFENAC SODIUM 75 MG PO TBEC: 75.0000 mg | DELAYED_RELEASE_TABLET | Freq: Two times a day (BID) | ORAL | Status: DC

## 2015-08-20 ENCOUNTER — Other Ambulatory Visit: Payer: Self-pay | Admitting: *Deleted

## 2015-08-20 MED ORDER — AMLODIPINE BESYLATE 10 MG PO TABS
10.0000 mg | ORAL_TABLET | Freq: Every day | ORAL | Status: DC
Start: 2015-08-20 — End: 2015-11-04

## 2015-08-24 ENCOUNTER — Ambulatory Visit (INDEPENDENT_AMBULATORY_CARE_PROVIDER_SITE_OTHER): Payer: Managed Care, Other (non HMO) | Admitting: Family Medicine

## 2015-08-24 ENCOUNTER — Encounter: Payer: Self-pay | Admitting: Family Medicine

## 2015-08-24 VITALS — BP 140/86 | HR 72 | Temp 97.1°F | Ht 72.0 in | Wt 202.4 lb

## 2015-08-24 DIAGNOSIS — J019 Acute sinusitis, unspecified: Secondary | ICD-10-CM | POA: Diagnosis not present

## 2015-08-24 DIAGNOSIS — F419 Anxiety disorder, unspecified: Secondary | ICD-10-CM | POA: Insufficient documentation

## 2015-08-24 DIAGNOSIS — F329 Major depressive disorder, single episode, unspecified: Secondary | ICD-10-CM

## 2015-08-24 DIAGNOSIS — F418 Other specified anxiety disorders: Secondary | ICD-10-CM | POA: Diagnosis not present

## 2015-08-24 DIAGNOSIS — F32A Depression, unspecified: Secondary | ICD-10-CM | POA: Insufficient documentation

## 2015-08-24 MED ORDER — FLUTICASONE PROPIONATE 50 MCG/ACT NA SUSP
1.0000 | Freq: Two times a day (BID) | NASAL | Status: AC | PRN
Start: 2015-08-24 — End: ?

## 2015-08-24 MED ORDER — CITALOPRAM HYDROBROMIDE 20 MG PO TABS: 20.0000 mg | ORAL_TABLET | Freq: Every day | ORAL | Status: DC

## 2015-08-24 MED ORDER — AZITHROMYCIN 250 MG PO TABS: ORAL_TABLET | ORAL | Status: DC

## 2015-08-24 NOTE — Progress Notes (Signed)
BP 140/86 mmHg  Pulse 72  Temp(Src) 97.1 F (36.2 C) (Oral)  Ht 6' (1.829 m)  Wt 202 lb 6.4 oz (91.808 kg)  BMI 27.44 kg/m2   Subjective:    Patient ID: Jeffrey Logan, male    DOB: 1955/12/04, 60 y.o.   MRN: [id number]  HPI: Jeffrey Logan is a 60 y.o. male presenting on 08/24/2015 for Medication Refill; Sinusitis; and Cough   HPI Anxiety and depression recheck Patient is coming in today for an anxiety and depression recheck. He has been on citalopram 20 mg daily. Feels like he does really well when he is on that medication. He is coming in today because he ran out. He denies any suicidal ideations or thoughts of hurting himself.  Sinus congestion and cough Patient was on Chantix for smoking cessation and is mostly quit just has a cigarette every few days now. He is going to finish quitting on his own. Since he has been having a lot more sinus congestion and cough which is productive of green and yellow sputum. He denies any fevers or chills or sick contacts that he knows of. This has been going on for a month. He denies any shortness of breath or wheezing.  Relevant past medical, surgical, family and social history reviewed and updated as indicated. Interim medical history since our last visit reviewed. Allergies and medications reviewed and updated.  Review of Systems  Constitutional: Negative for fever and chills.  HENT: Positive for congestion, postnasal drip, rhinorrhea, sinus pressure, sneezing and sore throat. Negative for ear discharge, ear pain and voice change.   Eyes: Negative for pain, discharge, redness and visual disturbance.  Respiratory: Positive for cough. Negative for chest tightness, shortness of breath and wheezing.   Cardiovascular: Negative for chest pain and leg swelling.  Gastrointestinal: Negative for abdominal pain, diarrhea and constipation.  Genitourinary: Negative for difficulty urinating.  Musculoskeletal: Negative for back pain and gait problem.  Skin:  Negative for rash.  Neurological: Negative for syncope, light-headedness and headaches.  Psychiatric/Behavioral: Negative for suicidal ideas, sleep disturbance, self-injury, dysphoric mood and decreased concentration. The patient is not nervous/anxious.   All other systems reviewed and are negative.   Per HPI unless specifically indicated above     Medication List       This list is accurate as of: 08/24/15  8:35 AM.  Always use your most recent med list.               amLODipine 10 MG tablet  Commonly known as:  NORVASC  Take 1 tablet (10 mg total) by mouth daily.     aspirin 81 MG tablet  Take 81 mg by mouth daily.     azithromycin 250 MG tablet  Commonly known as:  ZITHROMAX  Take 2 the first day and then one each day after.     citalopram 20 MG tablet  Commonly known as:  CELEXA  Take 1 tablet (20 mg total) by mouth daily.     diclofenac 75 MG EC tablet  Commonly known as:  VOLTAREN  Take 1 tablet (75 mg total) by mouth 2 (two) times daily.     fluticasone 50 MCG/ACT nasal spray  Commonly known as:  FLONASE  Place 1 spray into both nostrils 2 (two) times daily as needed for allergies or rhinitis.     omeprazole 40 MG capsule  Commonly known as:  PRILOSEC  Take 1 capsule (40 mg total) by mouth daily.     oxyCODONE-acetaminophen 5-325  MG tablet  Commonly known as:  PERCOCET/ROXICET  Take by mouth every 4 (four) hours as needed for severe pain.     testosterone cypionate 100 MG/ML injection  Commonly known as:  DEPOTESTOTERONE CYPIONATE  Inject into the muscle every 14 (fourteen) days. For IM use only     traMADol 50 MG tablet  Commonly known as:  ULTRAM  Take by mouth daily.           Objective:    BP 140/86 mmHg  Pulse 72  Temp(Src) 97.1 F (36.2 C) (Oral)  Ht 6' (1.829 m)  Wt 202 lb 6.4 oz (91.808 kg)  BMI 27.44 kg/m2  Wt Readings from Last 3 Encounters:  08/24/15 202 lb 6.4 oz (91.808 kg)  06/04/15 192 lb 3.2 oz (87.181 kg)  06/02/15 192  lb 6.4 oz (87.272 kg)    Physical Exam  Constitutional: He is oriented to person, place, and time. He appears well-developed and well-nourished. No distress.  HENT:  Right Ear: Tympanic membrane, external ear and ear canal normal.  Left Ear: Tympanic membrane, external ear and ear canal normal.  Nose: Mucosal edema and rhinorrhea present. No sinus tenderness. No epistaxis. Right sinus exhibits maxillary sinus tenderness. Right sinus exhibits no frontal sinus tenderness. Left sinus exhibits maxillary sinus tenderness. Left sinus exhibits no frontal sinus tenderness.  Mouth/Throat: Uvula is midline and mucous membranes are normal. Posterior oropharyngeal edema and posterior oropharyngeal erythema present. No oropharyngeal exudate or tonsillar abscesses.  Eyes: Conjunctivae and EOM are normal. Pupils are equal, round, and reactive to light. Right eye exhibits no discharge. No scleral icterus.  Neck: Neck supple. No thyromegaly present.  Cardiovascular: Normal rate, regular rhythm, normal heart sounds and intact distal pulses.   No murmur heard. Pulmonary/Chest: Effort normal and breath sounds normal. No respiratory distress. He has no wheezes.  Musculoskeletal: Normal range of motion. He exhibits no edema.  Lymphadenopathy:    He has no cervical adenopathy.  Neurological: He is alert and oriented to person, place, and time. Coordination normal.  Skin: Skin is warm and dry. No rash noted. He is not diaphoretic.  Psychiatric: He has a normal mood and affect. His behavior is normal. Judgment and thought content normal.  Vitals reviewed.   Results for orders placed or performed in visit on 06/02/15  CBC with Differential/Platelet  Result Value Ref Range   WBC 7.8 3.4 - 10.8 x10E3/uL   RBC 4.26 4.14 - 5.80 x10E6/uL   Hemoglobin 13.5 12.6 - 17.7 g/dL   Hematocrit 39.8 37.5 - 51.0 %   MCV 93 79 - 97 fL   MCH 31.7 26.6 - 33.0 pg   MCHC 33.9 31.5 - 35.7 g/dL   RDW 14.4 12.3 - 15.4 %   Platelets  147 (L) 150 - 379 x10E3/uL   Neutrophils 54 %   Lymphs 32 %   Monocytes 13 %   Eos 1 %   Basos 0 %   Neutrophils Absolute 4.3 1.4 - 7.0 x10E3/uL   Lymphocytes Absolute 2.5 0.7 - 3.1 x10E3/uL   Monocytes Absolute 1.0 (H) 0.1 - 0.9 x10E3/uL   EOS (ABSOLUTE) 0.0 0.0 - 0.4 x10E3/uL   Basophils Absolute 0.0 0.0 - 0.2 x10E3/uL   Immature Granulocytes 0 %   Immature Grans (Abs) 0.0 0.0 - 0.1 x10E3/uL  CMP14+EGFR  Result Value Ref Range   Glucose 83 65 - 99 mg/dL   BUN 14 6 - 24 mg/dL   Creatinine, Ser 1.15 0.76 - 1.27 mg/dL  GFR calc non Af Amer 70 >59 mL/min/1.73   GFR calc Af Amer 81 >59 mL/min/1.73   BUN/Creatinine Ratio 12 9 - 20   Sodium 138 136 - 144 mmol/L   Potassium 4.2 3.5 - 5.2 mmol/L   Chloride 98 97 - 106 mmol/L   CO2 26 18 - 29 mmol/L   Calcium 8.7 8.7 - 10.2 mg/dL   Total Protein 6.3 6.0 - 8.5 g/dL   Albumin 3.6 3.5 - 5.5 g/dL   Globulin, Total 2.7 1.5 - 4.5 g/dL   Albumin/Globulin Ratio 1.3 1.1 - 2.5   Bilirubin Total 0.7 0.0 - 1.2 mg/dL   Alkaline Phosphatase 89 39 - 117 IU/L   AST 23 0 - 40 IU/L   ALT 21 0 - 44 IU/L  Lipase  Result Value Ref Range   Lipase 35 0 - 59 U/L      Assessment & Plan:       Problem List Items Addressed This Visit      Other   Anxiety and depression   Relevant Medications   citalopram (CELEXA) 20 MG tablet    Other Visit Diagnoses    Acute rhinosinusitis    -  Primary    Been going on for a month, we'll do Z-Pak and Flonase and antihistamine    Relevant Medications    azithromycin (ZITHROMAX) 250 MG tablet    fluticasone (FLONASE) 50 MCG/ACT nasal spray        Follow up plan: Return if symptoms worsen or fail to improve.  Counseling provided for all of the vaccine components No orders of the defined types were placed in this encounter.    Caryl Pina, MD [facility] 08/24/2015, 8:35 AM

## 2015-08-25 ENCOUNTER — Telehealth: Payer: Self-pay | Admitting: Family Medicine

## 2015-08-25 ENCOUNTER — Telehealth: Payer: Self-pay | Admitting: *Deleted

## 2015-08-25 DIAGNOSIS — J019 Acute sinusitis, unspecified: Secondary | ICD-10-CM

## 2015-08-25 MED ORDER — AZITHROMYCIN 250 MG PO TABS: ORAL_TABLET | ORAL | Status: DC

## 2015-08-25 MED ORDER — ESOMEPRAZOLE MAGNESIUM 40 MG PO CPDR
40.0000 mg | DELAYED_RELEASE_CAPSULE | Freq: Two times a day (BID) | ORAL | Status: DC
Start: 2015-08-25 — End: 2016-05-29

## 2015-08-25 NOTE — Telephone Encounter (Signed)
Pt aware rx sent to pharmacy.

## 2015-08-25 NOTE — Telephone Encounter (Signed)
done

## 2015-09-03 ENCOUNTER — Encounter: Payer: Self-pay | Admitting: Physical Therapy

## 2015-09-03 ENCOUNTER — Ambulatory Visit: Payer: Worker's Compensation | Attending: Orthopaedic Surgery | Admitting: Physical Therapy

## 2015-09-03 DIAGNOSIS — M6281 Muscle weakness (generalized): Secondary | ICD-10-CM | POA: Diagnosis present

## 2015-09-03 DIAGNOSIS — M25511 Pain in right shoulder: Secondary | ICD-10-CM | POA: Diagnosis not present

## 2015-09-03 DIAGNOSIS — M25611 Stiffness of right shoulder, not elsewhere classified: Secondary | ICD-10-CM | POA: Diagnosis present

## 2015-09-03 DIAGNOSIS — R531 Weakness: Secondary | ICD-10-CM | POA: Insufficient documentation

## 2015-09-03 NOTE — Therapy (Signed)
[facility] [address] [place], [place], [address] Phone: [phone]   Fax:  [phone]  Physical Therapy Evaluation  Patient Details  Name: Jeffrey Logan MRN: [id number] Date of Birth: [birth year] Referring Provider: Marcene Corning, MD  Encounter Date: 09/03/2015      PT End of Session - 09/03/15 0821    Visit Number 1   Number of Visits 12   Date for PT Re-Evaluation 10/15/15   PT Start Time 0821   PT Stop Time 0859   PT Time Calculation (min) 38 min   Activity Tolerance Patient tolerated treatment well   Behavior During Therapy [facility] for tasks assessed/performed      Past Medical History  Diagnosis Date  . Hypertension   . GERD (gastroesophageal reflux disease)   . Sleep apnea 2012    wears a cpap at night    Past Surgical History  Procedure Laterality Date  . Rotator cuff repair  12/25/14    right shoulder  . Lumbar disc surgery    . Lumbar fusion      L5-S1  . Back surgery      screw placement  . Rib fracture surgery  2002    removal of left 9th rib  . Shoulder surgery Right 08/10/2015    There were no vitals filed for this visit.  Visit Diagnosis:  Pain in joint of right shoulder - Plan: PT plan of care cert/re-cert  Stiffness of right shoulder joint - Plan: PT plan of care cert/re-cert  Weakness - Plan: PT plan of care cert/re-cert      Subjective Assessment - 09/03/15 0821    Subjective Patient is s/p R shoulder scope on 08/20/15 to remove bone spurs.   Patient Stated Goals Return right shoulder to previous level of function.   Currently in Pain? Yes   Pain Score 5    Pain Location Shoulder   Pain Orientation Right   Pain Descriptors / Indicators Sore   Pain Type Surgical pain   Pain Onset 1 to 4 weeks ago   Pain Frequency Constant   Aggravating Factors  moving arm   Pain Relieving Factors ice   Effect of Pain on Daily Activities activity increases            [facility] PT Assessment - 09/03/15  0001    Assessment   Medical Diagnosis R shoulder scope   Referring Provider Marcene Corning, MD   Onset Date/Surgical Date 08/20/15   Next MD Visit 09/28/15   Balance Screen   Has the patient fallen in the past 6 months No   Has the patient had a decrease in activity level because of a fear of falling?  No   Is the patient reluctant to leave their home because of a fear of falling?  No   Prior Function   Level of Independence Independent   ROM / Strength   AROM / PROM / Strength AROM;Strength   AROM   AROM Assessment Site Shoulder   Right/Left Shoulder Right   Right Shoulder Flexion 128 Degrees   Right Shoulder ABduction 132 Degrees  154 passive   Right Shoulder Internal Rotation 60 Degrees   Right Shoulder External Rotation 58 Degrees   PROM   PROM Assessment Site Shoulder   Right Shoulder Flexion 150 Degrees   Right Shoulder Internal Rotation 68 Degrees   Right Shoulder External Rotation 61 Degrees   Strength   Overall Strength Deficits   Overall Strength Comments R mid traps 4+/5,  low traps 4-/5   Strength Assessment Site Shoulder   Right/Left Shoulder Right   Right Shoulder Flexion 4+/5   Right Shoulder Extension 5/5   Right Shoulder ABduction 4/5   Right Shoulder Internal Rotation 5/5   Right Shoulder External Rotation 4+/5   Flexibility   Soft Tissue Assessment /Muscle Length --  Tightness of R pecs, external  and internal shoulder rotators.   Palpation   Palpation comment TPs at teres minor, major insertions,  pecs; tender at [facility] joint                   [facility] Adult PT Treatment/Exercise - 09/03/15 0001    Shoulder Exercises: Pulleys   Flexion 3 minutes   Shoulder Exercises: Stretch   Internal Rotation Stretch 30 seconds  with towel   Wall Stretch - Flexion --  10 reps    Other Shoulder Stretches stretch from Lat bar using body weight 2  x 15 sec   Iontophoresis   Type of Iontophoresis Dexamethasone   Location R superior shoulder   Dose 1.0 ml 1  of 6   Time 8                PT Education - 09/03/15 0911    Education provided Yes   Education Details HEP   Person(s) Educated Patient   Methods Explanation;Demonstration   Comprehension Verbalized understanding;Returned demonstration             PT Long Term Goals - 09/03/15 1319    PT LONG TERM GOAL #1   Title Ind with a HEP.   Time 6   Period Weeks   Status New   PT LONG TERM GOAL #2   Title Active right shoulder flexion to 155 degrees in standing   Time 6   Period Weeks   Status New   PT LONG TERM GOAL #3   Title Active ER to 80 degrees.   Time 6   Period Weeks   Status New   PT LONG TERM GOAL #4   Title Patient reach behind back to T10 with right hand.   Time 6   Period Weeks   Status New   PT LONG TERM GOAL #5   Title Right shoulder strength= 5/5.   Time 6   Period Weeks   Status New   PT LONG TERM GOAL #6   Title Patient able to perform ADL's with right shoulder pain not > 2/10.   Time 6   Period Weeks   Status New               Plan - 09/03/15 0911    Clinical Impression Statement Patient presents with increased ROM and strength since shoulder scope 08/20/15. He continues to have tightness in shoulder muscles limtiing full ROM and still has pain although it is less than prior to latest surgery. Patient has rounded shoulders R > L.  Patient may benefit from dry needling to release TPs and tight muscles.    Pt will benefit from skilled therapeutic intervention in order to improve on the following deficits Decreased range of motion;Pain;Decreased strength;Impaired flexibility;Postural dysfunction;Decreased activity tolerance   Rehab Potential Excellent   PT Frequency 2x / week   PT Duration 6 weeks   PT Treatment/Interventions ADLs/Self Care Home Management;Cryotherapy;Electrical Stimulation;Moist Heat;Therapeutic exercise;Manual techniques;Taping;Dry needling;Ultrasound;Neuromuscular re-education;Patient/family education;Passive range  of motion;Iontophoresis /ml Dexamethasone   PT Next Visit Plan Assess ionto. Continue stretching for ROM, STW and strengthening. Refer to [facility] for DN  if approved.   PT Home Exercise Plan towel stretch for IR, wall stretch for flexion   Consulted and Agree with Plan of Care Patient         Problem List Patient Active Problem List   Diagnosis Date Noted  . Anxiety and depression 08/24/2015  . Low testosterone 04/22/2015  . GERD (gastroesophageal reflux disease) 04/22/2015  . Barrett's esophagus 04/22/2015  . Essential hypertension, benign 04/22/2015    Solon Palm PT  09/03/2015, 1:25 PM  [facility] [address] [place], [place], [address] Phone: [phone]   Fax:  [phone]  Name: Coulson Wehner MRN: [id number] Date of Birth: 03/15/1956

## 2015-09-07 ENCOUNTER — Ambulatory Visit: Payer: Worker's Compensation | Admitting: Physical Therapy

## 2015-09-07 ENCOUNTER — Encounter: Payer: Self-pay | Admitting: Physical Therapy

## 2015-09-07 DIAGNOSIS — M25611 Stiffness of right shoulder, not elsewhere classified: Secondary | ICD-10-CM

## 2015-09-07 DIAGNOSIS — M25511 Pain in right shoulder: Secondary | ICD-10-CM

## 2015-09-07 DIAGNOSIS — M6281 Muscle weakness (generalized): Secondary | ICD-10-CM

## 2015-09-07 DIAGNOSIS — R531 Weakness: Secondary | ICD-10-CM

## 2015-09-07 NOTE — Therapy (Signed)
[facility] [address] [place], [place], [address] Phone: [phone]   Fax:  [phone]  Physical Therapy Treatment  Patient Details  Name: Jeffrey Logan MRN: [id number] Date of Birth: 01-31-1956 Referring Provider: Marcene Corning, MD  Encounter Date: 09/07/2015      PT End of Session - 09/07/15 0828    Visit Number 2   Number of Visits 12   Date for PT Re-Evaluation 10/15/15   PT Start Time 0815   PT Stop Time 0858   PT Time Calculation (min) 43 min   Activity Tolerance Patient tolerated treatment well   Behavior During Therapy [facility] for tasks assessed/performed      Past Medical History  Diagnosis Date  . Hypertension   . GERD (gastroesophageal reflux disease)   . Sleep apnea 2012    wears a cpap at night    Past Surgical History  Procedure Laterality Date  . Rotator cuff repair  12/25/14    right shoulder  . Lumbar disc surgery    . Lumbar fusion      L5-S1  . Back surgery      screw placement  . Rib fracture surgery  2002    removal of left 9th rib  . Shoulder surgery Right 08/10/2015    There were no vitals filed for this visit.  Visit Diagnosis:  Pain in joint of right shoulder  Stiffness of right shoulder joint  Weakness  Right shoulder pain  Muscle weakness of right upper extremity  Shoulder stiffness, right      Subjective Assessment - 09/07/15 0819    Subjective no complaints after last treatment   Patient Stated Goals Return right shoulder to previous level of function.   Currently in Pain? Yes   Pain Score 3    Pain Location Shoulder   Pain Orientation Right   Pain Descriptors / Indicators Sore   Pain Type Surgical pain   Pain Onset 1 to 4 weeks ago   Pain Frequency Constant   Aggravating Factors  activity   Pain Relieving Factors rest            [facility] PT Assessment - 09/07/15 0001    AROM   AROM Assessment Site Shoulder   Right/Left Shoulder Right   Right Shoulder External  Rotation 60 Degrees   PROM   PROM Assessment Site Shoulder   Right/Left Shoulder Right   Right Shoulder Flexion 142 Degrees   Right Shoulder External Rotation 67 Degrees                     [facility] Adult PT Treatment/Exercise - 09/07/15 0001    Shoulder Exercises: Supine   Other Supine Exercises Cane for flexion and ER 3x10 each   Shoulder Exercises: Pulleys   Flexion 3 minutes   Other Pulley Exercises UE ranger for elevation and circles 3x10 each   Iontophoresis   Type of Iontophoresis Dexamethasone   Location R superior shoulder   Dose 1.0 ml 2 of 6   Time 8   Manual Therapy   Manual Therapy Passive ROM   Passive ROM PROM for right shoulder flexion/ER with gentle holds to increase ROM, then rhythmic stabilization for flexion /ext at 90 degrees flexion                     PT Long Term Goals - 09/03/15 1319    PT LONG TERM GOAL #1   Title Ind with a HEP.  Time 6   Period Weeks   Status New   PT LONG TERM GOAL #2   Title Active right shoulder flexion to 155 degrees in standing   Time 6   Period Weeks   Status New   PT LONG TERM GOAL #3   Title Active ER to 80 degrees.   Time 6   Period Weeks   Status New   PT LONG TERM GOAL #4   Title Patient reach behind back to T10 with right hand.   Time 6   Period Weeks   Status New   PT LONG TERM GOAL #5   Title Right shoulder strength= 5/5.   Time 6   Period Weeks   Status New   PT LONG TERM GOAL #6   Title Patient able to perform ADL's with right shoulder pain not > 2/10.   Time 6   Period Weeks   Status New               Plan - 09/07/15 4540    Clinical Impression Statement Patient progressing slowly with good progression, some tightness for ROM in right shoulder today and some soreness 3/10. Patient had no complaints today or after last treament. Current goals ongoing due to pain, Full ROM, and strength deficits   Pt will benefit from skilled therapeutic intervention in order to  improve on the following deficits Decreased range of motion;Pain;Decreased strength;Impaired flexibility;Postural dysfunction;Decreased activity tolerance   Rehab Potential Excellent   PT Frequency 2x / week   PT Duration 6 weeks   PT Treatment/Interventions ADLs/Self Care Home Management;Cryotherapy;Electrical Stimulation;Moist Heat;Therapeutic exercise;Manual techniques;Taping;Dry needling;Ultrasound;Neuromuscular re-education;Patient/family education;Passive range of motion;Iontophoresis /ml Dexamethasone   PT Next Visit Plan Continue stretching for ROM, STW and strengthening. Refer to [facility] for DN if approved.   PT Home Exercise Plan towel stretch for IR, wall stretch for flexion   Consulted and Agree with Plan of Care Patient        Problem List Patient Active Problem List   Diagnosis Date Noted  . Anxiety and depression 08/24/2015  . Low testosterone 04/22/2015  . GERD (gastroesophageal reflux disease) 04/22/2015  . Barrett's esophagus 04/22/2015  . Essential hypertension, benign 04/22/2015    Jeffrey Logan, PTA 09/07/2015, 9:01 AM  [facility] [address] [place], [place], [address] Phone: [phone]   Fax:  [phone]  Name: Jeffrey Logan MRN: [id number] Date of Birth: 08-02-1955

## 2015-09-09 ENCOUNTER — Ambulatory Visit: Payer: Worker's Compensation | Admitting: Physical Therapy

## 2015-09-09 ENCOUNTER — Encounter: Payer: Self-pay | Admitting: Physical Therapy

## 2015-09-09 DIAGNOSIS — M25611 Stiffness of right shoulder, not elsewhere classified: Secondary | ICD-10-CM

## 2015-09-09 DIAGNOSIS — R531 Weakness: Secondary | ICD-10-CM

## 2015-09-09 DIAGNOSIS — M6281 Muscle weakness (generalized): Secondary | ICD-10-CM

## 2015-09-09 DIAGNOSIS — M25511 Pain in right shoulder: Secondary | ICD-10-CM | POA: Diagnosis not present

## 2015-09-09 NOTE — Therapy (Signed)
[facility] [address] [place], [place], [address] Phone: [phone]   Fax:  [phone]  Physical Therapy Treatment  Patient Details  Name: Jeffrey Logan MRN: [id number] Date of Birth: 1955-12-15 Referring Provider: Marcene Corning, MD  Encounter Date: 09/09/2015      PT End of Session - 09/09/15 0830    Visit Number 3   Number of Visits 12   Date for PT Re-Evaluation 10/15/15   PT Start Time 0814   PT Stop Time 0858   PT Time Calculation (min) 44 min   Activity Tolerance Patient tolerated treatment well   Behavior During Therapy [facility] for tasks assessed/performed      Past Medical History  Diagnosis Date  . Hypertension   . GERD (gastroesophageal reflux disease)   . Sleep apnea 2012    wears a cpap at night    Past Surgical History  Procedure Laterality Date  . Rotator cuff repair  12/25/14    right shoulder  . Lumbar disc surgery    . Lumbar fusion      L5-S1  . Back surgery      screw placement  . Rib fracture surgery  2002    removal of left 9th rib  . Shoulder surgery Right 08/10/2015    There were no vitals filed for this visit.  Visit Diagnosis:  Pain in joint of right shoulder  Stiffness of right shoulder joint  Weakness  Right shoulder pain  Muscle weakness of right upper extremity  Shoulder stiffness, right      Subjective Assessment - 09/09/15 0821    Subjective no complaints after last treatment, yet very sore after raking yesterday   Patient Stated Goals Return right shoulder to previous level of function.   Currently in Pain? Yes   Pain Score 6    Pain Location Shoulder   Pain Orientation Right   Pain Descriptors / Indicators Sore   Pain Type Surgical pain   Pain Onset 1 to 4 weeks ago   Pain Frequency Constant   Aggravating Factors  increased moving of shoulder or activity   Pain Relieving Factors rest and ice            [facility] PT Assessment - 09/09/15 0001    AROM   AROM  Assessment Site Shoulder   Right/Left Shoulder Right   Right Shoulder External Rotation 65 Degrees   PROM   PROM Assessment Site Shoulder   Right/Left Shoulder Right   Right Shoulder Flexion 137 Degrees   Right Shoulder External Rotation 70 Degrees                     [facility] Adult PT Treatment/Exercise - 09/09/15 0001    Shoulder Exercises: Supine   Other Supine Exercises Cane for flexion and ER 3x10 each   Shoulder Exercises: Pulleys   Flexion --    Other Pulley Exercises UE ranger for elevation and circles 3x10 each   Iontophoresis   Type of Iontophoresis Dexamethasone   Location R superior shoulder   Dose 1.0 ml 3 of 6   Time 8   Manual Therapy   Manual Therapy Passive ROM   Passive ROM PROM for right shoulder flexion/ER with gentle holds to increase ROM, then rhythmic stabilization for flexion /ext at 90 degrees flexion                     PT Long Term Goals - 09/03/15 1319  PT LONG TERM GOAL #1   Title Ind with a HEP.   Time 6   Period Weeks   Status New   PT LONG TERM GOAL #2   Title Active right shoulder flexion to 155 degrees in standing   Time 6   Period Weeks   Status New   PT LONG TERM GOAL #3   Title Active ER to 80 degrees.   Time 6   Period Weeks   Status New   PT LONG TERM GOAL #4   Title Patient reach behind back to T10 with right hand.   Time 6   Period Weeks   Status New   PT LONG TERM GOAL #5   Title Right shoulder strength= 5/5.   Time 6   Period Weeks   Status New   PT LONG TERM GOAL #6   Title Patient able to perform ADL's with right shoulder pain not > 2/10.   Time 6   Period Weeks   Status New               Plan - 09/09/15 1610    Clinical Impression Statement Patient progressing well just had some increased pain after raking yesterday. Today did not progress ther ex due to soreness in shoulder. patient able to perform ADL's yet needs to keep activitity level low to avoid soreness. unable to  meet any further goals due to pain, ROM and strength deficits.   Pt will benefit from skilled therapeutic intervention in order to improve on the following deficits Decreased range of motion;Pain;Decreased strength;Impaired flexibility;Postural dysfunction;Decreased activity tolerance   Rehab Potential Excellent   PT Frequency 2x / week   PT Duration 6 weeks   PT Treatment/Interventions ADLs/Self Care Home Management;Cryotherapy;Electrical Stimulation;Moist Heat;Therapeutic exercise;Manual techniques;Taping;Dry needling;Ultrasound;Neuromuscular re-education;Patient/family education;Passive range of motion;Iontophoresis /ml Dexamethasone   PT Next Visit Plan Continue stretching for ROM, STW and strengthening   Consulted and Agree with Plan of Care Patient        Problem List Patient Active Problem List   Diagnosis Date Noted  . Anxiety and depression 08/24/2015  . Low testosterone 04/22/2015  . GERD (gastroesophageal reflux disease) 04/22/2015  . Barrett's esophagus 04/22/2015  . Essential hypertension, benign 04/22/2015    [name], PTA  09/09/2015, 9:11 AM  [facility] [address] [place], [place], [address] Phone: [phone]   Fax:  [phone]  Name: Jeffrey Logan MRN: [id number] Date of Birth: Sep 26, 1955

## 2015-09-11 ENCOUNTER — Encounter: Payer: Self-pay | Admitting: *Deleted

## 2015-09-11 ENCOUNTER — Ambulatory Visit: Payer: Worker's Compensation | Admitting: *Deleted

## 2015-09-11 DIAGNOSIS — M25511 Pain in right shoulder: Secondary | ICD-10-CM

## 2015-09-11 DIAGNOSIS — M25611 Stiffness of right shoulder, not elsewhere classified: Secondary | ICD-10-CM

## 2015-09-11 NOTE — Therapy (Signed)
[facility] [address] [place], [place], [address] Phone: [phone]   Fax:  [phone]  Physical Therapy Treatment  Patient Details  Name: Jeffrey Logan MRN: [id number] Date of Birth: 1956-07-16 Referring Provider: Marcene Corning, MD  Encounter Date: 09/11/2015      PT End of Session - 09/11/15 0824    Visit Number 4   Number of Visits 12   Date for PT Re-Evaluation 10/15/15   PT Start Time 0815   PT Stop Time 0900   PT Time Calculation (min) 45 min      Past Medical History  Diagnosis Date  . Hypertension   . GERD (gastroesophageal reflux disease)   . Sleep apnea 2012    wears a cpap at night    Past Surgical History  Procedure Laterality Date  . Rotator cuff repair  12/25/14    right shoulder  . Lumbar disc surgery    . Lumbar fusion      L5-S1  . Back surgery      screw placement  . Rib fracture surgery  2002    removal of left 9th rib  . Shoulder surgery Right 08/10/2015    There were no vitals filed for this visit.  Visit Diagnosis:  Pain in joint of right shoulder  Stiffness of right shoulder joint      Subjective Assessment - 09/11/15 0818    Subjective RT shldr  is sore    Patient Stated Goals Return right shoulder to previous level of function.   Currently in Pain? Yes   Pain Score 4    Pain Location Shoulder   Pain Orientation Right   Pain Descriptors / Indicators Sore   Pain Type Surgical pain   Pain Onset 1 to 4 weeks ago                         [facility] Adult PT Treatment/Exercise - 09/11/15 0001    Shoulder Exercises: Supine   Other Supine Exercises Cane for flexion and ER 3x10 each   Shoulder Exercises: Pulleys   Flexion --  5 mins   Other Pulley Exercises UE ranger for elevation and circles 3x10 each   Iontophoresis   Type of Iontophoresis Dexamethasone   Location R superior shoulder   Dose 1.0 ml 4 of 6   Time 8   Manual Therapy   Manual Therapy Passive ROM   Passive ROM PROM for right shoulder flexion/ER with gentle holds to increase ROM, then rhythmic stabilization for flexion /ext at 90 degrees flexion                     PT Long Term Goals - 09/03/15 1319    PT LONG TERM GOAL #1   Title Ind with a HEP.   Time 6   Period Weeks   Status New   PT LONG TERM GOAL #2   Title Active right shoulder flexion to 155 degrees in standing   Time 6   Period Weeks   Status New   PT LONG TERM GOAL #3   Title Active ER to 80 degrees.   Time 6   Period Weeks   Status New   PT LONG TERM GOAL #4   Title Patient reach behind back to T10 with right hand.   Time 6   Period Weeks   Status New   PT LONG TERM GOAL #5   Title Right shoulder strength= 5/5.  Time 6   Period Weeks   Status New   PT LONG TERM GOAL #6   Title Patient able to perform ADL's with right shoulder pain not > 2/10.   Time 6   Period Weeks   Status New               Plan - 09/11/15 0830    Clinical Impression Statement Pt did fairly well with Rx today for RT shldr., but continues to have some soreness in ACJ. He did well with pulleys and UE ranger with mild increase in soreness.. Independent in supine cane exs. Goals are ongoing at this time   Pt will benefit from skilled therapeutic intervention in order to improve on the following deficits Decreased range of motion;Pain;Decreased strength;Impaired flexibility;Postural dysfunction;Decreased activity tolerance   Rehab Potential Excellent   PT Frequency 2x / week   PT Duration 6 weeks   PT Next Visit Plan Continue stretching for ROM, STW and strengthening. TRY UBE   PT Home Exercise Plan towel stretch for IR, wall stretch for flexion        Problem List Patient Active Problem List   Diagnosis Date Noted  . Anxiety and depression 08/24/2015  . Low testosterone 04/22/2015  . GERD (gastroesophageal reflux disease) 04/22/2015  . Barrett's esophagus 04/22/2015  . Essential hypertension, benign  04/22/2015    [name],CHRIS, PTA 09/11/2015, 9:13 AM  [facility] [address] [place], [place], [address] Phone: [phone]   Fax:  [phone]  Name: Jeffrey Logan MRN: [id number] Date of Birth: 09-16-1955

## 2015-09-14 ENCOUNTER — Encounter: Payer: Self-pay | Admitting: Physical Therapy

## 2015-09-14 ENCOUNTER — Ambulatory Visit: Payer: Worker's Compensation | Attending: Orthopaedic Surgery | Admitting: Physical Therapy

## 2015-09-14 DIAGNOSIS — M25611 Stiffness of right shoulder, not elsewhere classified: Secondary | ICD-10-CM

## 2015-09-14 DIAGNOSIS — R531 Weakness: Secondary | ICD-10-CM | POA: Insufficient documentation

## 2015-09-14 DIAGNOSIS — M25511 Pain in right shoulder: Secondary | ICD-10-CM | POA: Insufficient documentation

## 2015-09-14 NOTE — Therapy (Signed)
[facility] [address] [place], [place], [address] Phone: [phone]   Fax:  [phone]  Physical Therapy Treatment  Patient Details  Name: Jeffrey Logan MRN: [id number] Date of Birth: February 19, 1956 Referring Provider: Marcene Corning, MD  Encounter Date: 09/14/2015      PT End of Session - 09/14/15 0826    Visit Number 5   Number of Visits 12   Date for PT Re-Evaluation 10/15/15   PT Start Time 0826   PT Stop Time 0904   PT Time Calculation (min) 38 min   Activity Tolerance Patient tolerated treatment well   Behavior During Therapy [facility] for tasks assessed/performed      Past Medical History  Diagnosis Date  . Hypertension   . GERD (gastroesophageal reflux disease)   . Sleep apnea 2012    wears a cpap at night    Past Surgical History  Procedure Laterality Date  . Rotator cuff repair  12/25/14    right shoulder  . Lumbar disc surgery    . Lumbar fusion      L5-S1  . Back surgery      screw placement  . Rib fracture surgery  2002    removal of left 9th rib  . Shoulder surgery Right 08/10/2015    There were no vitals filed for this visit.  Visit Diagnosis:  Pain in joint of right shoulder  Stiffness of right shoulder joint  Weakness      Subjective Assessment - 09/14/15 0828    Subjective States that he tries to do normal things around the house and sometimes has soreness and sometimes throbbing in R shoulder. Reported interest in trying theraband exercises today.   Limitations Lifting   Patient Stated Goals Return right shoulder to previous level of function.   Currently in Pain? Yes   Pain Score 4    Pain Location Shoulder   Pain Orientation Right   Pain Descriptors / Indicators Sore   Pain Type Surgical pain   Pain Onset 1 to 4 weeks ago            [facility] PT Assessment - 09/14/15 0001    Assessment   Medical Diagnosis R shoulder scope   Onset Date/Surgical Date 08/20/15   Next MD Visit 09/28/15    Prior Therapy --                     [facility] Adult PT Treatment/Exercise - 09/14/15 0001    Shoulder Exercises: Supine   External Rotation AAROM;Right   Flexion AAROM;Both  3x10 reps   Shoulder Exercises: Standing   Protraction Strengthening;Right;Theraband  3x10 reps   Theraband Level (Shoulder Protraction) Level 1 (Yellow)   External Rotation Strengthening;Right;20 reps;Theraband   Theraband Level (Shoulder External Rotation) Level 1 (Yellow)   Internal Rotation Strengthening;Right;Theraband  3x10 reps   Theraband Level (Shoulder Internal Rotation) Level 1 (Yellow)   Row Strengthening;Right;Theraband  3x10 reps   Theraband Level (Shoulder Row) Level 1 (Yellow)   Shoulder Exercises: Pulleys   Other Pulley Exercises UE ranger for elevation and circles 3x10 each   Shoulder Exercises: ROM/Strengthening   UBE (Upper Arm Bike) 120 RPM x6 min   Iontophoresis   Type of Iontophoresis Dexamethasone   Location R superior shoulder   Dose 2.0 ml 5 of 6   Time 8   Manual Therapy   Manual Therapy Passive ROM   Passive ROM PROM for right shoulder flexion with gentle holds to increase ROM, then rhythmic  stabilization for flexion and ER at 45 and 90 degrees each                     PT Long Term Goals - 09/03/15 1319    PT LONG TERM GOAL #1   Title Ind with a HEP.   Time 6   Period Weeks   Status New   PT LONG TERM GOAL #2   Title Active right shoulder flexion to 155 degrees in standing   Time 6   Period Weeks   Status New   PT LONG TERM GOAL #3   Title Active ER to 80 degrees.   Time 6   Period Weeks   Status New   PT LONG TERM GOAL #4   Title Patient reach behind back to T10 with right hand.   Time 6   Period Weeks   Status New   PT LONG TERM GOAL #5   Title Right shoulder strength= 5/5.   Time 6   Period Weeks   Status New   PT LONG TERM GOAL #6   Title Patient able to perform ADL's with right shoulder pain not > 2/10.   Time 6   Period Weeks    Status New               Plan - 09/14/15 1114    Clinical Impression Statement Patient tolerated today's treatment well with only report of difficulty with R shoulder ER strengthening with yellow theraband. Patient continues to experience R shoulder sore and throbbing sensation with activity per patient report. R shoulder ER with yellow theraband was only exercise limited to 20 reps secondary to increased difficulty. Patient demonstrated good R shoulder stability with R shoulder rhythmic stabilizations into ER and flexion at both 45 and 90 deg. Audible click sound noted in R shoulder during PROM of R shoulder into flexion but only occured intermittantly. Iontophoresis patch placed over R anteriosuperior shoulder per patient request. No numerical pain rating provided by patient following treatment if pain was present.   Pt will benefit from skilled therapeutic intervention in order to improve on the following deficits Decreased range of motion;Pain;Decreased strength;Impaired flexibility;Postural dysfunction;Decreased activity tolerance   Rehab Potential Excellent   Clinical Impairments Affecting Rehab Potential RCR surgery 12/25/2014, ACJ surgery 08/20/2015   PT Frequency 2x / week   PT Duration 6 weeks   PT Treatment/Interventions ADLs/Self Care Home Management;Cryotherapy;Electrical Stimulation;Moist Heat;Therapeutic exercise;Manual techniques;Taping;Dry needling;Ultrasound;Neuromuscular re-education;Patient/family education;Passive range of motion;Iontophoresis /ml Dexamethasone   PT Next Visit Plan Continue stretching for ROM, STW and strengthening per MPT POC   PT Home Exercise Plan towel stretch for IR, wall stretch for flexion   Consulted and Agree with Plan of Care Patient        Problem List Patient Active Problem List   Diagnosis Date Noted  . Anxiety and depression 08/24/2015  . Low testosterone 04/22/2015  . GERD (gastroesophageal reflux disease) 04/22/2015  .  Barrett's esophagus 04/22/2015  . Essential hypertension, benign 04/22/2015    Evelene Croon, PTA 09/14/2015, 11:24 AM  [facility] [address] [place], [place], [address] Phone: [phone]   Fax:  [phone]  Name: Nikolay Demetriou MRN: [id number] Date of Birth: 1955-09-16

## 2015-09-15 ENCOUNTER — Ambulatory Visit: Payer: Worker's Compensation | Admitting: *Deleted

## 2015-09-15 ENCOUNTER — Encounter: Payer: Self-pay | Admitting: *Deleted

## 2015-09-15 DIAGNOSIS — M25611 Stiffness of right shoulder, not elsewhere classified: Secondary | ICD-10-CM

## 2015-09-15 DIAGNOSIS — M25511 Pain in right shoulder: Secondary | ICD-10-CM | POA: Diagnosis not present

## 2015-09-15 DIAGNOSIS — M6281 Muscle weakness (generalized): Secondary | ICD-10-CM

## 2015-09-15 DIAGNOSIS — R531 Weakness: Secondary | ICD-10-CM

## 2015-09-15 NOTE — Therapy (Signed)
[facility] [address] [place], [place], [address] Phone: [phone]   Fax:  [phone]  Physical Therapy Treatment  Patient Details  Name: Jeffrey Logan MRN: [id number] Date of Birth: 1956-01-29 Referring Provider: Marcene Corning, MD  Encounter Date: 09/15/2015      PT End of Session - 09/15/15 0916    Visit Number 6   Number of Visits 12   Date for PT Re-Evaluation 10/15/15   PT Start Time 0900   PT Stop Time 0947   PT Time Calculation (min) 47 min      Past Medical History  Diagnosis Date  . Hypertension   . GERD (gastroesophageal reflux disease)   . Sleep apnea 2012    wears a cpap at night    Past Surgical History  Procedure Laterality Date  . Rotator cuff repair  12/25/14    right shoulder  . Lumbar disc surgery    . Lumbar fusion      L5-S1  . Back surgery      screw placement  . Rib fracture surgery  2002    removal of left 9th rib  . Shoulder surgery Right 08/10/2015    There were no vitals filed for this visit.  Visit Diagnosis:  Pain in joint of right shoulder  Stiffness of right shoulder joint  Weakness  Right shoulder pain  Muscle weakness of right upper extremity  Shoulder stiffness, right      Subjective Assessment - 09/15/15 0908    Subjective States that he tries to do normal things around the house and sometimes has soreness and sometimes throbbing in R shoulder. Reported interest in trying theraband exercises today.   Limitations Lifting   Patient Stated Goals Return right shoulder to previous level of function.   Currently in Pain? Yes   Pain Score 4    Pain Location Shoulder   Pain Orientation Right   Pain Descriptors / Indicators Sore   Pain Type Surgical pain   Pain Onset 1 to 4 weeks ago   Pain Frequency Constant   Aggravating Factors  ADLs            [facility] PT Assessment - 09/15/15 0001    PROM   PROM Assessment Site Shoulder   Right/Left Shoulder Right   Right  Shoulder Flexion 155 Degrees   Right Shoulder External Rotation 80 Degrees                     [facility] Adult PT Treatment/Exercise - 09/15/15 0001    Shoulder Exercises: Standing   Protraction Strengthening;Right;Theraband  3x10 reps    punches   Theraband Level (Shoulder Protraction) Level 1 (Yellow)   External Rotation Strengthening;Right;20 reps;Theraband   Theraband Level (Shoulder External Rotation) Level 1 (Yellow)   Internal Rotation Strengthening;Right;Theraband  3x10 reps   Theraband Level (Shoulder Internal Rotation) Level 1 (Yellow)   Row Strengthening;Right;Theraband  3x10 reps   Theraband Level (Shoulder Row) Level 1 (Yellow)   Other Standing Exercises scaption x 10   Shoulder Exercises: Pulleys   Other Pulley Exercises UE ranger for elevation and circles 3x10 each   Shoulder Exercises: ROM/Strengthening   UBE (Upper Arm Bike) 120 RPM x6 min   Iontophoresis   Type of Iontophoresis Dexamethasone   Location R superior shoulder   Dose 2.0 ml 5 of 6   Time 8   Manual Therapy   Manual Therapy Passive ROM   Passive ROM PROM for right shoulder flexion with gentle  holds to increase ROM, then rhythmic stabilization for flexion and ER at 45 and 90 degrees each                     PT Long Term Goals - 09/03/15 1319    PT LONG TERM GOAL #1   Title Ind with a HEP.   Time 6   Period Weeks   Status New   PT LONG TERM GOAL #2   Title Active right shoulder flexion to 155 degrees in standing   Time 6   Period Weeks   Status New   PT LONG TERM GOAL #3   Title Active ER to 80 degrees.   Time 6   Period Weeks   Status New   PT LONG TERM GOAL #4   Title Patient reach behind back to T10 with right hand.   Time 6   Period Weeks   Status New   PT LONG TERM GOAL #5   Title Right shoulder strength= 5/5.   Time 6   Period Weeks   Status New   PT LONG TERM GOAL #6   Title Patient able to perform ADL's with right shoulder pain not > 2/10.   Time 6    Period Weeks   Status New               Plan - 09/15/15 0981    Clinical Impression Statement patient did fairly well today with exs, but stillhas some popping and soreness in RT ACJ. His ROM was 155 degrees in flexion and 80 degrees in ER today. Ionto placed over RT ACJ   Pt will benefit from skilled therapeutic intervention in order to improve on the following deficits Decreased range of motion;Pain;Decreased strength;Impaired flexibility;Postural dysfunction;Decreased activity tolerance   Clinical Impairments Affecting Rehab Potential RCR surgery 12/25/2014, ACJ surgery 08/20/2015   PT Duration 6 weeks   PT Treatment/Interventions ADLs/Self Care Home Management;Cryotherapy;Electrical Stimulation;Moist Heat;Therapeutic exercise;Manual techniques;Taping;Dry needling;Ultrasound;Neuromuscular re-education;Patient/family education;Passive range of motion;Iontophoresis /ml Dexamethasone   PT Next Visit Plan Continue stretching for ROM, STW and strengthening per MPT POC   PT Home Exercise Plan towel stretch for IR, wall stretch for flexion   Consulted and Agree with Plan of Care Patient        Problem List Patient Active Problem List   Diagnosis Date Noted  . Anxiety and depression 08/24/2015  . Low testosterone 04/22/2015  . GERD (gastroesophageal reflux disease) 04/22/2015  . Barrett's esophagus 04/22/2015  . Essential hypertension, benign 04/22/2015    RAMSEUR,CHRIS, PTA 09/15/2015, 10:00 AM  [facility] [address] [place], [place], [address] Phone: [phone]   Fax:  [phone]  Name: Jeffrey Logan MRN: [id number] Date of Birth: November 18, 1955

## 2015-09-18 ENCOUNTER — Ambulatory Visit: Payer: Worker's Compensation | Admitting: Physical Therapy

## 2015-09-18 ENCOUNTER — Encounter: Payer: Self-pay | Admitting: Physical Therapy

## 2015-09-18 DIAGNOSIS — M25511 Pain in right shoulder: Secondary | ICD-10-CM

## 2015-09-18 DIAGNOSIS — M25611 Stiffness of right shoulder, not elsewhere classified: Secondary | ICD-10-CM

## 2015-09-18 DIAGNOSIS — R531 Weakness: Secondary | ICD-10-CM

## 2015-09-18 NOTE — Therapy (Signed)
[facility] [address] [place], [place], [address] Phone: [phone]   Fax:  [phone]  Physical Therapy Treatment  Patient Details  Name: Jeffrey Logan MRN: [id number] Date of Birth: 08/16/1955 Referring Provider: Marcene Corning, MD  Encounter Date: 09/18/2015      PT End of Session - 09/18/15 0826    Visit Number 7   Number of Visits 12   Date for PT Re-Evaluation 10/15/15   PT Start Time 0816   PT Stop Time 0854   PT Time Calculation (min) 38 min   Activity Tolerance Patient tolerated treatment well   Behavior During Therapy [name] for tasks assessed/performed      Past Medical History  Diagnosis Date  . Hypertension   . GERD (gastroesophageal reflux disease)   . Sleep apnea 2012    wears a cpap at night    Past Surgical History  Procedure Laterality Date  . Rotator cuff repair  12/25/14    right shoulder  . Lumbar disc surgery    . Lumbar fusion      L5-S1  . Back surgery      screw placement  . Rib fracture surgery  2002    removal of left 9th rib  . Shoulder surgery Right 08/10/2015    There were no vitals filed for this visit.  Visit Diagnosis:  Pain in joint of right shoulder  Stiffness of right shoulder joint  Weakness      Subjective Assessment - 09/18/15 0835    Subjective States that he woke up this morning with increased R shoulder soreness.   Limitations Lifting   Patient Stated Goals Return right shoulder to previous level of function.   Currently in Pain? Yes   Pain Score 5    Pain Location Shoulder   Pain Orientation Right   Pain Descriptors / Indicators Sore   Pain Type Surgical pain   Pain Onset 1 to 4 weeks ago            [name] PT Assessment - 09/18/15 0001    Assessment   Medical Diagnosis R shoulder scope   Onset Date/Surgical Date 08/20/15   Next MD Visit 09/28/15                     [name] - 09/18/15 0001    Shoulder Exercises:  Supine   External Rotation AAROM;Right   Flexion AAROM;Both  3x10 reps   Shoulder Exercises: Standing   Protraction Strengthening;Right;Theraband  3x10 reps   Theraband Level (Shoulder Protraction) Level 1 (Yellow)   External Rotation Strengthening;Right;Theraband  3x10 reps   Theraband Level (Shoulder External Rotation) Level 1 (Yellow)   Internal Rotation Strengthening;Right;Theraband  3x10 reps   Theraband Level (Shoulder Internal Rotation) Level 1 (Yellow)   Row Strengthening;Right;Theraband  3x10 reps   Theraband Level (Shoulder Row) Level 1 (Yellow)   Shoulder Exercises: Pulleys   Other Pulley Exercises UE ranger for elevation and circles 3x10 each   Shoulder Exercises: ROM/Strengthening   UBE (Upper Arm Bike) 120 RPM x6 min   Manual Therapy   Manual Therapy Passive ROM   Passive ROM PROM for right shoulder flexion/ER with gentle holds to increase ROM, then rhythmic stabilization for flexion and ER at 45 and 90 degrees each                     PT Long Term Goals - 09/03/15 1319    PT LONG TERM GOAL #1  Title Ind with a HEP.   Time 6   Period Weeks   Status New   PT LONG TERM GOAL #2   Title Active right shoulder flexion to 155 degrees in standing   Time 6   Period Weeks   Status New   PT LONG TERM GOAL #3   Title Active ER to 80 degrees.   Time 6   Period Weeks   Status New   PT LONG TERM GOAL #4   Title Patient reach behind back to T10 with right hand.   Time 6   Period Weeks   Status New   PT LONG TERM GOAL #5   Title Right shoulder strength= 5/5.   Time 6   Period Weeks   Status New   PT LONG TERM GOAL #6   Title Patient able to perform ADL's with right shoulder pain not > 2/10.   Time 6   Period Weeks   Status New               Plan - 09/18/15 0855    Clinical Impression Statement Patient tolerated today's treatment fairly well although he presented in clinic with increased R shoulder sorenees for unknown reason. Completed  all exercises without verbalization of increased pain or soreness. Gacial grimacing noted with supine AAROM flexion and end range PROM into flexion. Experienced dizziness upon laying down from sitting and states he has been having vertigo symptoms recently. Firm end feels noted with PROM of R shoulder into flexion and ER along with good stabilization with rhythmic stabilzations. Iontophorsis not completed today secondary to completing 6 total treatments.   Pt will benefit from skilled therapeutic intervention in order to improve on the following deficits Decreased range of motion;Pain;Decreased strength;Impaired flexibility;Postural dysfunction;Decreased activity tolerance   Rehab Potential Excellent   Clinical Impairments Affecting Rehab Potential RCR surgery 12/25/2014, ACJ surgery 08/20/2015   PT Frequency 2x / week   PT Duration 6 weeks   PT Treatment/Interventions ADLs/Self Care Home Management;Cryotherapy;Electrical Stimulation;Moist Heat;Therapeutic exercise;Manual techniques;Taping;Dry needling;Ultrasound;Neuromuscular re-education;Patient/family education;Passive range of motion;Iontophoresis /ml Dexamethasone   PT Next Visit Plan Continue stretching for ROM, STW and strengthening per MPT POC   PT Home Exercise Plan towel stretch for IR, wall stretch for flexion   Consulted and Agree with Plan of Care Patient        Problem List Patient Active Problem List   Diagnosis Date Noted  . Anxiety and depression 08/24/2015  . Low testosterone 04/22/2015  . GERD (gastroesophageal reflux disease) 04/22/2015  . Barrett's esophagus 04/22/2015  . Essential hypertension, benign 04/22/2015    Evelene Croon, PTA 09/18/2015, 8:57 AM  [name] [address] [place], [place], [address] Phone: [phone]   Fax:  [phone]  Name: Jeffrey Logan MRN: [id number] Date of Birth: Feb 21, 1956

## 2015-09-21 ENCOUNTER — Ambulatory Visit (INDEPENDENT_AMBULATORY_CARE_PROVIDER_SITE_OTHER): Payer: Managed Care, Other (non HMO) | Admitting: Family Medicine

## 2015-09-21 ENCOUNTER — Encounter: Payer: Self-pay | Admitting: Physical Therapy

## 2015-09-21 ENCOUNTER — Ambulatory Visit: Payer: Worker's Compensation | Admitting: Physical Therapy

## 2015-09-21 ENCOUNTER — Encounter: Payer: Self-pay | Admitting: Family Medicine

## 2015-09-21 VITALS — BP 125/78 | HR 64 | Temp 97.9°F | Ht 72.0 in | Wt 202.6 lb

## 2015-09-21 DIAGNOSIS — M25611 Stiffness of right shoulder, not elsewhere classified: Secondary | ICD-10-CM

## 2015-09-21 DIAGNOSIS — M25511 Pain in right shoulder: Secondary | ICD-10-CM | POA: Diagnosis not present

## 2015-09-21 DIAGNOSIS — H8110 Benign paroxysmal vertigo, unspecified ear: Secondary | ICD-10-CM | POA: Diagnosis not present

## 2015-09-21 DIAGNOSIS — M6281 Muscle weakness (generalized): Secondary | ICD-10-CM

## 2015-09-21 DIAGNOSIS — R531 Weakness: Secondary | ICD-10-CM

## 2015-09-21 MED ORDER — MECLIZINE HCL 25 MG PO TABS: 25.0000 mg | ORAL_TABLET | Freq: Three times a day (TID) | ORAL | Status: DC | PRN

## 2015-09-21 NOTE — Progress Notes (Signed)
BP 125/78 mmHg  Pulse 64  Temp(Src) 97.9 F (36.6 C) (Oral)  Ht 6' (1.829 m)  Wt 202 lb 9.6 oz (91.899 kg)  BMI 27.47 kg/m2   Subjective:    Patient ID: Jeffrey Logan, male    DOB: [birth year], 59 y.o.   MRN: [id number]  HPI: Jeffrey Logan is a 60 y.o. male presenting on 09/21/2015 for Dizziness   HPI Dizziness Patient has been having dizziness for the past 3-4 days. He describes it as a spinning sensation and it occurs when he does different movements like lying down, turning and getting up from the bed. He has had this chronic sinus congestion issue that we have been attempting to treat and clear. He has had vertigo once previously. He denies any nausea or vomiting. He denies any syncope or lightheadedness. He denies any chest pain or dyspnea.  Relevant past medical, surgical, family and social history reviewed and updated as indicated. Interim medical history since our last visit reviewed. Allergies and medications reviewed and updated.  Review of Systems  Constitutional: Negative for fever and chills.  HENT: Positive for sinus pressure and sneezing. Negative for congestion, ear discharge and ear pain.   Eyes: Negative for discharge and visual disturbance.  Respiratory: Negative for cough, shortness of breath and wheezing.   Cardiovascular: Negative for chest pain and leg swelling.  Gastrointestinal: Negative for nausea, vomiting, abdominal pain, diarrhea and constipation.  Genitourinary: Negative for difficulty urinating.  Musculoskeletal: Negative for back pain and gait problem.  Skin: Negative for rash.  Neurological: Positive for dizziness. Negative for tremors, syncope, weakness, light-headedness, numbness and headaches.  All other systems reviewed and are negative.   Per HPI unless specifically indicated above     Medication List       This list is accurate as of: 09/21/15 10:04 AM.  Always use your most recent med list.               amLODipine 10 MG tablet    Commonly known as:  NORVASC  Take 1 tablet (10 mg total) by mouth daily.     aspirin 81 MG tablet  Take 81 mg by mouth daily.     citalopram 20 MG tablet  Commonly known as:  CELEXA  Take 1 tablet (20 mg total) by mouth daily.     diclofenac 75 MG EC tablet  Commonly known as:  VOLTAREN  Take 1 tablet (75 mg total) by mouth 2 (two) times daily.     esomeprazole 40 MG capsule  Commonly known as:  NEXIUM  Take 1 capsule (40 mg total) by mouth 2 (two) times daily before a meal.     fluticasone 50 MCG/ACT nasal spray  Commonly known as:  FLONASE  Place 1 spray into both nostrils 2 (two) times daily as needed for allergies or rhinitis.     meclizine 25 MG tablet  Commonly known as:  ANTIVERT  Take 1 tablet (25 mg total) by mouth 3 (three) times daily as needed for dizziness.     testosterone cypionate 100 MG/ML injection  Commonly known as:  DEPOTESTOTERONE CYPIONATE  Inject into the muscle every 14 (fourteen) days. For IM use only     traMADol 50 MG tablet  Commonly known as:  ULTRAM  Take by mouth daily.           Objective:    BP 125/78 mmHg  Pulse 64  Temp(Src) 97.9 F (36.6 C) (Oral)  Ht 6' (1.829 m)  [employer]  202 lb 9.6 oz (91.899 kg)  BMI 27.47 kg/m2  Wt Readings from Last 3 Encounters:  09/21/15 202 lb 9.6 oz (91.899 kg)  08/24/15 202 lb 6.4 oz (91.808 kg)  06/04/15 192 lb 3.2 oz (87.181 kg)    Physical Exam  Constitutional: He is oriented to person, place, and time. He appears well-developed and well-nourished. No distress.  Eyes: Conjunctivae and EOM are normal. Pupils are equal, round, and reactive to light. Right eye exhibits no discharge. No scleral icterus.  Neck: Neck supple. No thyromegaly present.  Cardiovascular: Normal rate, regular rhythm, normal heart sounds and intact distal pulses.   No murmur heard. Pulmonary/Chest: Effort normal and breath sounds normal. No respiratory distress. He has no wheezes.  Musculoskeletal: Normal range of motion. He  exhibits no edema.  Lymphadenopathy:    He has no cervical adenopathy.  Neurological: He is alert and oriented to person, place, and time. Coordination normal.  Skin: Skin is warm and dry. No rash noted. He is not diaphoretic.  Psychiatric: He has a normal mood and affect. His behavior is normal.  Vitals reviewed.     Assessment & Plan:   Problem List Items Addressed This Visit    None    Visit Diagnoses    Benign paroxysmal positional vertigo, unspecified laterality    -  Primary    Relevant Medications    meclizine (ANTIVERT) 25 MG tablet    Other Relevant Orders    Ambulatory referral to ENT       Follow up plan: Return if symptoms worsen or fail to improve.  Counseling provided for all of the vaccine components Orders Placed This Encounter  Procedures  . Ambulatory referral to ENT    Arville Care, MD [facility] 09/21/2015, 10:04 AM

## 2015-09-21 NOTE — Therapy (Signed)
[facility] [address] [place], [place], [address] Phone: [phone]   Fax:  [phone]  Physical Therapy Treatment  Patient Details  Name: Jeffrey Logan MRN: [id number] Date of Birth: 05-13-1956 Referring Provider: Marcene Corning, MD  Encounter Date: 09/21/2015      PT End of Session - 09/21/15 0853    Visit Number 8   Number of Visits 12   Date for PT Re-Evaluation 10/15/15   PT Start Time 0815   PT Stop Time 0855   PT Time Calculation (min) 40 min   Activity Tolerance Patient tolerated treatment well   Behavior During Therapy [facility] for tasks assessed/performed      Past Medical History  Diagnosis Date  . Hypertension   . GERD (gastroesophageal reflux disease)   . Sleep apnea 2012    wears a cpap at night    Past Surgical History  Procedure Laterality Date  . Rotator cuff repair  12/25/14    right shoulder  . Lumbar disc surgery    . Lumbar fusion      L5-S1  . Back surgery      screw placement  . Rib fracture surgery  2002    removal of left 9th rib  . Shoulder surgery Right 08/10/2015    There were no vitals filed for this visit.  Visit Diagnosis:  Pain in joint of right shoulder  Stiffness of right shoulder joint  Weakness  Right shoulder pain  Muscle weakness of right upper extremity  Shoulder stiffness, right      Subjective Assessment - 09/21/15 0829    Subjective Patient had an episode of vertigo which caused him st stumble and fall onto a counter to catch his fall landing on right shoulder causing some bruising. MPT cleared for therapy keeping pain level at comfortable tolerance   Limitations Lifting   Patient Stated Goals Return right shoulder to previous level of function.   Currently in Pain? Yes   Pain Score 8    Pain Location Shoulder   Pain Orientation Right   Pain Descriptors / Indicators Sore   Pain Type Surgical pain   Pain Onset 1 to 4 weeks ago   Pain Frequency Constant   Aggravating Factors  Use of sholder   Pain Relieving Factors rest            [facility] PT Assessment - 09/21/15 0001    AROM   AROM Assessment Site Shoulder   Right/Left Shoulder Right   Right Shoulder External Rotation 67 Degrees   PROM   Right Shoulder Flexion 135 Degrees   Right Shoulder External Rotation 70 Degrees                     [facility] Adult PT Treatment/Exercise - 09/21/15 0001    Shoulder Exercises: Standing   Protraction Strengthening;Right;Theraband  3x10   Theraband Level (Shoulder Protraction) Level 1 (Yellow)   External Rotation Strengthening;Right;Theraband  3x10   Theraband Level (Shoulder External Rotation) Level 1 (Yellow)   Internal Rotation Strengthening;Right;Theraband   Theraband Level (Shoulder Internal Rotation) Level 1 (Yellow)  3x10   Row Strengthening;Right;Theraband  3x10   Theraband Level (Shoulder Row) Level 1 (Yellow)   Shoulder Exercises: Pulleys   Other Pulley Exercises UE ranger for elevation and circles 3x10 each   Shoulder Exercises: ROM/Strengthening   UBE (Upper Arm Bike) 120 RPM x4 min   Manual Therapy   Manual Therapy Passive ROM   Passive ROM PROM for right shoulder  flexion/ER with gentle holds to increase ROM, then rhythmic stabilization for flexion and ER at 45 and 90 degrees each                     PT Long Term Goals - 09/03/15 1319    PT LONG TERM GOAL #1   Title Ind with a HEP.   Time 6   Period Weeks   Status New   PT LONG TERM GOAL #2   Title Active right shoulder flexion to 155 degrees in standing   Time 6   Period Weeks   Status New   PT LONG TERM GOAL #3   Title Active ER to 80 degrees.   Time 6   Period Weeks   Status New   PT LONG TERM GOAL #4   Title Patient reach behind back to T10 with right hand.   Time 6   Period Weeks   Status New   PT LONG TERM GOAL #5   Title Right shoulder strength= 5/5.   Time 6   Period Weeks   Status New   PT LONG TERM GOAL #6   Title Patient  able to perform ADL's with right shoulder pain not > 2/10.   Time 6   Period Weeks   Status New               Plan - 09/21/15 1610    Clinical Impression Statement Patient progressing slowly due to a fall he had on his right shoulder causing increased pain. Patient had decreased ROM for right shoulder flexion and ER today. Patient is to focus on comfortable ROM and activities until he goes to see MD per MPT. Unable to meet any further goals due to dercresed ROM and pain deficits.    Pt will benefit from skilled therapeutic intervention in order to improve on the following deficits Decreased range of motion;Pain;Decreased strength;Impaired flexibility;Postural dysfunction;Decreased activity tolerance   Rehab Potential Excellent   Clinical Impairments Affecting Rehab Potential RCR surgery 12/25/2014, ACJ surgery 08/20/2015   PT Frequency 2x / week   PT Duration 6 weeks   PT Treatment/Interventions ADLs/Self Care Home Management;Cryotherapy;Electrical Stimulation;Moist Heat;Therapeutic exercise;Manual techniques;Taping;Dry needling;Ultrasound;Neuromuscular re-education;Patient/family education;Passive range of motion;Iontophoresis /ml Dexamethasone   PT Next Visit Plan cont with POC per patient tolerance (MD. [name] 09/28/15)   Consulted and Agree with Plan of Care Patient        Problem List Patient Active Problem List   Diagnosis Date Noted  . Anxiety and depression 08/24/2015  . Low testosterone 04/22/2015  . GERD (gastroesophageal reflux disease) 04/22/2015  . Barrett's esophagus 04/22/2015  . Essential hypertension, benign 04/22/2015    [name], PTA 09/21/2015, 9:02 AM  [facility] [address] [place], [place], [address] Phone: [phone]   Fax:  [phone]  Name: Jeffrey Logan MRN: [id number] Date of Birth: 1956/06/07

## 2015-09-23 ENCOUNTER — Encounter: Payer: Self-pay | Admitting: Physical Therapy

## 2015-09-24 ENCOUNTER — Ambulatory Visit: Payer: Worker's Compensation | Admitting: Physical Therapy

## 2015-09-24 ENCOUNTER — Encounter: Payer: Self-pay | Admitting: Physical Therapy

## 2015-09-24 DIAGNOSIS — M25511 Pain in right shoulder: Secondary | ICD-10-CM | POA: Diagnosis not present

## 2015-09-24 DIAGNOSIS — M25611 Stiffness of right shoulder, not elsewhere classified: Secondary | ICD-10-CM

## 2015-09-24 DIAGNOSIS — R531 Weakness: Secondary | ICD-10-CM

## 2015-09-24 NOTE — Therapy (Signed)
[facility] [address] [place], [place], [address] Phone: [phone]   Fax:  [phone]  Physical Therapy Treatment  Patient Details  Name: Jeffrey Logan MRN: [id number] Date of Birth: 11-09-1955 Referring Provider: Marcene Corning, MD  Encounter Date: 09/24/2015      PT End of Session - 09/24/15 0818    Visit Number 9   Number of Visits 12   Date for PT Re-Evaluation 10/15/15   PT Start Time 0816   PT Stop Time 0857   PT Time Calculation (min) 41 min   Activity Tolerance Patient tolerated treatment well;Patient limited by pain   Behavior During Therapy [facility] for tasks assessed/performed      Past Medical History  Diagnosis Date  . Hypertension   . GERD (gastroesophageal reflux disease)   . Sleep apnea 2012    wears a cpap at night    Past Surgical History  Procedure Laterality Date  . Rotator cuff repair  12/25/14    right shoulder  . Lumbar disc surgery    . Lumbar fusion      L5-S1  . Back surgery      screw placement  . Rib fracture surgery  2002    removal of left 9th rib  . Shoulder surgery Right 08/10/2015    There were no vitals filed for this visit.  Visit Diagnosis:  Pain in joint of right shoulder  Stiffness of right shoulder joint  Weakness      Subjective Assessment - 09/24/15 0816    Subjective States that his shoulder is bothering him this morning and is still clicking. States that he went to MD regarding dizziness and he was given a medication that did not work per patient report but has done some exercises that has helped at this point.   Limitations Lifting   Patient Stated Goals Return right shoulder to previous level of function.   Currently in Pain? Yes   Pain Score 4    Pain Location Shoulder   Pain Orientation Right   Pain Descriptors / Indicators Sore   Pain Type Surgical pain   Pain Onset 1 to 4 weeks ago            [facility] PT Assessment - 09/24/15 0001    Assessment    Medical Diagnosis R shoulder scope   Onset Date/Surgical Date 08/20/15   Next MD Visit 09/28/15                     [facility] Adult PT Treatment/Exercise - 09/24/15 0001    Shoulder Exercises: Supine   External Rotation AAROM;Right  3x10 reps   Flexion AAROM;Both;Other (comment)  3x10 reps   Shoulder Exercises: Standing   Protraction Strengthening;Right;Theraband  3x10 reps   Theraband Level (Shoulder Protraction) Level 1 (Yellow)   External Rotation Strengthening;Right;Theraband  3x 10 reps   Theraband Level (Shoulder External Rotation) Level 1 (Yellow)   Internal Rotation Strengthening;Right;Theraband  3x 10 reps   Theraband Level (Shoulder Internal Rotation) Level 1 (Yellow)   Row Strengthening;Right;Theraband  3x10 reps   Theraband Level (Shoulder Row) Level 1 (Yellow)   Shoulder Exercises: Pulleys   Flexion Other (comment)  x4 min   Other Pulley Exercises UE ranger for elevation and circles 3x10 each   Shoulder Exercises: ROM/Strengthening   UBE (Upper Arm Bike) 120 RPM x4 min   Manual Therapy   Manual Therapy Passive ROM   Passive ROM PROM for right shoulder flexion/ER/IR with gentle holds to  increase ROM, then rhythmic stabilization for flexion and ER at 45 and 90 degrees each                     PT Long Term Goals - 09/03/15 1319    PT LONG TERM GOAL #1   Title Ind with a HEP.   Time 6   Period Weeks   Status New   PT LONG TERM GOAL #2   Title Active right shoulder flexion to 155 degrees in standing   Time 6   Period Weeks   Status New   PT LONG TERM GOAL #3   Title Active ER to 80 degrees.   Time 6   Period Weeks   Status New   PT LONG TERM GOAL #4   Title Patient reach behind back to T10 with right hand.   Time 6   Period Weeks   Status New   PT LONG TERM GOAL #5   Title Right shoulder strength= 5/5.   Time 6   Period Weeks   Status New   PT LONG TERM GOAL #6   Title Patient able to perform ADL's with right shoulder pain  not > 2/10.   Time 6   Period Weeks   Status New               Plan - 09/24/15 9562    Clinical Impression Statement Patient continues to be limited with R shoulder activty secondary to R shoulder soreness. Could not tolerate full 6 minutes on UBE this morning secondary to R shoulder discomfort and facial grimacing and complaint of increased soreness noted with PROM of R shoulder into flexion today. At end range R shoulder ER patient experienced pull at R [facility] joint. Had no complaint with rhythmic stabilizations of R shoulder today in supine. Experienced dizziness upon sitting from supine following today's treatment and required short rest break prior to standing. Bruising at R inferioposterior shoulder from patient's fall has turned to yellow coloration.   Pt will benefit from skilled therapeutic intervention in order to improve on the following deficits Decreased range of motion;Pain;Decreased strength;Impaired flexibility;Postural dysfunction;Decreased activity tolerance   Rehab Potential Excellent   Clinical Impairments Affecting Rehab Potential RCR surgery 12/25/2014, ACJ surgery 08/20/2015   PT Frequency 2x / week   PT Duration 6 weeks   PT Treatment/Interventions ADLs/Self Care Home Management;Cryotherapy;Electrical Stimulation;Moist Heat;Therapeutic exercise;Manual techniques;Taping;Dry needling;Ultrasound;Neuromuscular re-education;Patient/family education;Passive range of motion;Iontophoresis /ml Dexamethasone   PT Next Visit Plan cont with POC per patient tolerance (MD. [name] 09/28/15)   PT Home Exercise Plan towel stretch for IR, wall stretch for flexion   Consulted and Agree with Plan of Care Patient        Problem List Patient Active Problem List   Diagnosis Date Noted  . Anxiety and depression 08/24/2015  . Low testosterone 04/22/2015  . GERD (gastroesophageal reflux disease) 04/22/2015  . Barrett's esophagus 04/22/2015  . Essential hypertension, benign 04/22/2015     Evelene Croon, PTA 09/24/2015, 9:17 AM  [facility] [address] [place], [place], [address] Phone: [phone]   Fax:  [phone]  Name: Jeffrey Logan MRN: [id number] Date of Birth: October 16, 1955

## 2015-09-25 ENCOUNTER — Ambulatory Visit: Payer: Worker's Compensation | Admitting: Physical Therapy

## 2015-09-25 ENCOUNTER — Encounter: Payer: Self-pay | Admitting: Physical Therapy

## 2015-09-25 DIAGNOSIS — M25511 Pain in right shoulder: Secondary | ICD-10-CM | POA: Diagnosis not present

## 2015-09-25 DIAGNOSIS — R531 Weakness: Secondary | ICD-10-CM

## 2015-09-25 DIAGNOSIS — M25611 Stiffness of right shoulder, not elsewhere classified: Secondary | ICD-10-CM

## 2015-09-25 NOTE — Therapy (Signed)
[facility] [address] [place], [place], [address] Phone: [phone]   Fax:  [phone]  Physical Therapy Treatment  Patient Details  Name: Jeffrey Logan MRN: [id number] Date of Birth: 11-24-1955 Referring Provider: Marcene Corning, MD  Encounter Date: 09/25/2015      PT End of Session - 09/25/15 6962    Visit Number 10   Number of Visits 12   Date for PT Re-Evaluation 10/15/15   PT Start Time 0817   PT Stop Time 0900   PT Time Calculation (min) 43 min   Activity Tolerance Patient tolerated treatment well;Patient limited by pain   Behavior During Therapy [facility] for tasks assessed/performed      Past Medical History  Diagnosis Date  . Hypertension   . GERD (gastroesophageal reflux disease)   . Sleep apnea 2012    wears a cpap at night    Past Surgical History  Procedure Laterality Date  . Rotator cuff repair  12/25/14    right shoulder  . Lumbar disc surgery    . Lumbar fusion      L5-S1  . Back surgery      screw placement  . Rib fracture surgery  2002    removal of left 9th rib  . Shoulder surgery Right 08/10/2015    There were no vitals filed for this visit.  Visit Diagnosis:  Pain in joint of right shoulder  Stiffness of right shoulder joint  Weakness      Subjective Assessment - 09/25/15 0822    Subjective States that R shoulder still sore.   Limitations Lifting   Patient Stated Goals Return right shoulder to previous level of function.   Currently in Pain? Yes   Pain Score 4    Pain Location Shoulder   Pain Orientation Right   Pain Descriptors / Indicators Sore   Pain Type Surgical pain   Pain Onset 1 to 4 weeks ago            [facility] PT Assessment - 09/25/15 0001    Assessment   Medical Diagnosis R shoulder scope   Onset Date/Surgical Date 08/20/15   Next MD Visit 09/28/15   ROM / Strength   AROM / PROM / Strength PROM   PROM   Overall PROM  Deficits   PROM Assessment Site Shoulder   Right/Left Shoulder Right   Right Shoulder Flexion 120 Degrees   Right Shoulder ABduction 116 Degrees  scaption   Right Shoulder Internal Rotation 68 Degrees   Right Shoulder External Rotation 42 Degrees                     [facility] Adult PT Treatment/Exercise - 09/25/15 0001    Shoulder Exercises: Supine   Protraction --   External Rotation AAROM;Right;Other (comment)  3x10 reps   Flexion AAROM;Both;Other (comment)  3x10 reps   Shoulder Exercises: Standing   Protraction Strengthening;Right;Theraband  3x10 reps   Theraband Level (Shoulder Protraction) Level 1 (Yellow)   External Rotation Strengthening;Right;Theraband  3x10 reps   Theraband Level (Shoulder External Rotation) Level 1 (Yellow)   Internal Rotation Strengthening;Right;Theraband  3x10 reps   Theraband Level (Shoulder Internal Rotation) Level 1 (Yellow)   Row Strengthening;Right;Theraband  3x10 reps   Theraband Level (Shoulder Row) Level 1 (Yellow)   Shoulder Exercises: Pulleys   Flexion 3 minutes   Other Pulley Exercises UE ranger for elevation and circles 3x10 each   Shoulder Exercises: ROM/Strengthening   UBE (Upper Arm Bike) 120 RPM  x4 min   Manual Therapy   Manual Therapy Passive ROM;Soft tissue mobilization   Soft tissue mobilization MFR/STW to R Pectoralis Major to decrease tightness noted in lateral border   Passive ROM PROM of R shoulder into flex/scaption/ER/IR with gentle holds at end range and oscillations to promote R shoulder relaxation                     PT Long Term Goals - 09/03/15 1319    PT LONG TERM GOAL #1   Title Ind with a HEP.   Time 6   Period Weeks   Status New   PT LONG TERM GOAL #2   Title Active right shoulder flexion to 155 degrees in standing   Time 6   Period Weeks   Status New   PT LONG TERM GOAL #3   Title Active ER to 80 degrees.   Time 6   Period Weeks   Status New   PT LONG TERM GOAL #4   Title Patient reach behind back to T10 with right  hand.   Time 6   Period Weeks   Status New   PT LONG TERM GOAL #5   Title Right shoulder strength= 5/5.   Time 6   Period Weeks   Status New   PT LONG TERM GOAL #6   Title Patient able to perform ADL's with right shoulder pain not > 2/10.   Time 6   Period Weeks   Status New               Plan - 09/25/15 4098    Clinical Impression Statement Patient tolerated today's treatment fairly well although he continues to report R shoulder soreness and reports inability to complete 6 minutes on UBE but can complete theraband strengthening for 30 reps each. Patient reported inability to complete full set of [facility] ER with cane secondary to R [facility] joint discomfort. R shoulder ROM limited today secondary to pain and facial grimacing noted before end range was accomplished. Frequent oscillations of R shoulder utilzied to promote R shoulder relaxation. R shoulder ER greatly limited today which patient attributes possibbly to recent fall due to vertigo. Tightness noted in R lateral Pectoralis Major during manual therapy today. Patient experienced 8/10 at end range R shoulder ROM and 4-5/10 pain at rest following PROM per patient report.   Pt will benefit from skilled therapeutic intervention in order to improve on the following deficits Decreased range of motion;Pain;Decreased strength;Impaired flexibility;Postural dysfunction;Decreased activity tolerance   Rehab Potential Excellent   Clinical Impairments Affecting Rehab Potential RCR surgery 12/25/2014, ACJ surgery 08/20/2015   PT Frequency 2x / week   PT Duration 6 weeks   PT Treatment/Interventions ADLs/Self Care Home Management;Cryotherapy;Electrical Stimulation;Moist Heat;Therapeutic exercise;Manual techniques;Taping;Dry needling;Ultrasound;Neuromuscular re-education;Patient/family education;Passive range of motion;Iontophoresis /ml Dexamethasone   PT Next Visit Plan cont with POC per patient tolerance (MD. [name] 09/28/15)   PT Home Exercise  Plan towel stretch for IR, wall stretch for flexion   Consulted and Agree with Plan of Care Patient        Problem List Patient Active Problem List   Diagnosis Date Noted  . Anxiety and depression 08/24/2015  . Low testosterone 04/22/2015  . GERD (gastroesophageal reflux disease) 04/22/2015  . Barrett's esophagus 04/22/2015  . Essential hypertension, benign 04/22/2015    Florence Canner, PTA 09/25/2015 9:47 AM [place] MPT [facility] [address] [place], [place], [address] Phone: [phone]   Fax:  [phone]  Name: Jeffrey Logan MRN: [id number] Date of Birth: Nov 14, 1955

## 2015-09-30 ENCOUNTER — Encounter: Payer: Self-pay | Admitting: Physical Therapy

## 2015-10-01 ENCOUNTER — Encounter: Payer: Self-pay | Admitting: Physical Therapy

## 2015-10-01 ENCOUNTER — Ambulatory Visit: Payer: Worker's Compensation | Attending: Orthopaedic Surgery | Admitting: Physical Therapy

## 2015-10-01 DIAGNOSIS — M25511 Pain in right shoulder: Secondary | ICD-10-CM

## 2015-10-01 DIAGNOSIS — M25611 Stiffness of right shoulder, not elsewhere classified: Secondary | ICD-10-CM | POA: Diagnosis present

## 2015-10-01 DIAGNOSIS — M6281 Muscle weakness (generalized): Secondary | ICD-10-CM | POA: Diagnosis present

## 2015-10-01 DIAGNOSIS — R531 Weakness: Secondary | ICD-10-CM

## 2015-10-01 NOTE — Therapy (Signed)
[facility] [place], [place], [address] Phone: [phone]   Fax:  [phone]  Physical Therapy Treatment  Patient Details  Name: Jeffrey Logan MRN: [id number] Date of Birth: [birth year] Referring Provider: Melrose Nakayama, MD  Encounter Date: 10/01/2015      PT End of Session - 10/01/15 0833    Visit Number 11   Number of Visits 12   Date for PT Re-Evaluation 10/15/15   PT Start Time 0815   PT Stop Time 0856   PT Time Calculation (min) 41 min   Activity Tolerance Patient tolerated treatment well   Behavior During Therapy [facility] for tasks assessed/performed      Past Medical History  Diagnosis Date  . Hypertension   . GERD (gastroesophageal reflux disease)   . Sleep apnea 2012    wears a cpap at night    Past Surgical History  Procedure Laterality Date  . Rotator cuff repair  12/25/14    right shoulder  . Lumbar disc surgery    . Lumbar fusion      L5-S1  . Back surgery      screw placement  . Rib fracture surgery  2002    removal of left 9th rib  . Shoulder surgery Right 08/10/2015    There were no vitals filed for this visit.  Visit Diagnosis:  Pain in joint of right shoulder  Stiffness of right shoulder joint  Weakness  Right shoulder pain  Muscle weakness of right upper extremity  Shoulder stiffness, right      Subjective Assessment - 10/01/15 0818    Subjective Patient shoulder is sore today, has not been doing a lot due to wife in hospital, patient reported lifting a gas can about 30 pounds into back of truck   Limitations Lifting   Patient Stated Goals Return right shoulder to previous level of function.   Currently in Pain? Yes   Pain Score 4    Pain Location Shoulder   Pain Orientation Right   Pain Descriptors / Indicators Sore   Pain Type Surgical pain   Pain Onset 1 to 4 weeks ago   Pain Frequency Constant   Aggravating Factors  activity with shoulder   Pain Relieving Factors rest             [facility] PT Assessment - 10/01/15 0001    AROM   AROM Assessment Site Shoulder   Right/Left Shoulder Right   Right Shoulder Flexion 155 Degrees   Right Shoulder External Rotation 65 Degrees   PROM   PROM Assessment Site Shoulder   Right/Left Shoulder Right   Right Shoulder Flexion 150 Degrees  limited due to sharp pain in supine   Right Shoulder External Rotation 68 Degrees  limited by sharp pain in supine                     [facility] Adult PT Treatment/Exercise - 10/01/15 0001    Shoulder Exercises: Standing   Protraction Strengthening;Right;Theraband  3x10   Theraband Level (Shoulder Protraction) Level 1 (Yellow)   External Rotation Strengthening;Right;Theraband  3x10   Theraband Level (Shoulder External Rotation) Level 1 (Yellow)   Internal Rotation Strengthening;Right;Theraband  3x10   Theraband Level (Shoulder Internal Rotation) Level 1 (Yellow)   Row Strengthening;Right;Theraband  3x10   Theraband Level (Shoulder Row) Level 1 (Yellow)   Shoulder Exercises: Pulleys   Flexion 3 minutes   Other Pulley Exercises UE ranger for elevation and circles 3x10 each  Shoulder Exercises: ROM/Strengthening   UBE (Upper Arm Bike) 120 RPM x4 min   Manual Therapy   Manual Therapy Passive ROM;Soft tissue mobilization   Passive ROM PROM of R shoulder into flex/scaption/ER/IR with gentle holds at end range and oscillations to promote R shoulder relaxation                     PT Long Term Goals - 10/01/15 1324    PT LONG TERM GOAL #1   Title Ind with a HEP.   Time 6   Period Weeks   Status On-going   PT LONG TERM GOAL #2   Title Active right shoulder flexion to 155 degrees in standing   Period Weeks   Status Achieved  AROM 155 degrees (10/01/15)   PT LONG TERM GOAL #3   Title Active ER to 80 degrees.   Time 6   Period Weeks   Status On-going   PT LONG TERM GOAL #4   Title Patient reach behind back to T10 with right hand.   Time 6    Period Weeks   Status On-going   PT LONG TERM GOAL #5   Title Right shoulder strength= 5/5.   Time 6   Period Weeks   Status On-going   PT LONG TERM GOAL #6   Title Patient able to perform ADL's with right shoulder pain not > 2/10.   Time 6   Period Weeks   Status On-going  4-5/10 (10/01/15)               Plan - 10/01/15 4010    Clinical Impression Statement Patient progressing slowly this week, some increased soreness from picking up gas can. Patient has improved AROM for right shoulder flexion to 155 degrees and able to achieve standing, ROM in supine is limited due to sharp pain. Patient has pain up to 4-5/10 with ADL's at this time. Patient met LTG #2  today other goals ongoing due to pain and strength defits. End with biofreeze today.   Pt will benefit from skilled therapeutic intervention in order to improve on the following deficits Decreased range of motion;Pain;Decreased strength;Impaired flexibility;Postural dysfunction;Decreased activity tolerance   Rehab Potential Excellent   Clinical Impairments Affecting Rehab Potential RCR surgery 12/25/2014, ACJ surgery 08/20/2015   PT Frequency 2x / week   PT Duration 6 weeks   PT Treatment/Interventions ADLs/Self Care Home Management;Cryotherapy;Electrical Stimulation;Moist Heat;Therapeutic exercise;Manual techniques;Taping;Dry needling;Ultrasound;Neuromuscular re-education;Patient/family education;Passive range of motion;Iontophoresis 67m/ml Dexamethasone   PT Next Visit Plan cont with POC / MD note next visit (MD. DLatanya Maudlin3/6/17)   Consulted and Agree with Plan of Care Patient        Problem List Patient Active Problem List   Diagnosis Date Noted  . Anxiety and depression 08/24/2015  . Low testosterone 04/22/2015  . GERD (gastroesophageal reflux disease) 04/22/2015  . Barrett's esophagus 04/22/2015  . Essential hypertension, benign 04/22/2015    DPhillips Climes PTA 10/01/2015, 8:58 AM  [facility] [address]Phone: [phone]  Fax:  [phone] Name: Jeffrey ColuccioMRN: [id number] of Birth: [birth year]

## 2015-10-02 ENCOUNTER — Ambulatory Visit: Payer: Worker's Compensation | Attending: Orthopaedic Surgery | Admitting: *Deleted

## 2015-10-02 DIAGNOSIS — M25511 Pain in right shoulder: Secondary | ICD-10-CM | POA: Insufficient documentation

## 2015-10-02 DIAGNOSIS — R531 Weakness: Secondary | ICD-10-CM | POA: Insufficient documentation

## 2015-10-02 DIAGNOSIS — M25611 Stiffness of right shoulder, not elsewhere classified: Secondary | ICD-10-CM

## 2015-10-02 DIAGNOSIS — M6281 Muscle weakness (generalized): Secondary | ICD-10-CM | POA: Diagnosis present

## 2015-10-06 NOTE — Therapy (Signed)
[facility] [address] [place], [place], [address] Phone: [phone]   Fax:  [phone]  Physical Therapy Treatment  Patient Details  Name: Jeffrey Logan MRN: [id number] Date of Birth: 11/01/1955 Referring Provider: Marcene CorningPeter Dalldorf, MD  Encounter Date: 10/02/2015    Past Medical History  Diagnosis Date  . Hypertension   . GERD (gastroesophageal reflux disease)   . Sleep apnea 2012    wears a cpap at night    Past Surgical History  Procedure Laterality Date  . Rotator cuff repair  12/25/14    right shoulder  . Lumbar disc surgery    . Lumbar fusion      L5-S1  . Back surgery      screw placement  . Rib fracture surgery  2002    removal of left 9th rib  . Shoulder surgery Right 08/10/2015    There were no vitals filed for this visit.  Visit Diagnosis:  Pain in joint of right shoulder - Plan: PT plan of care cert/re-cert  Stiffness of right shoulder joint - Plan: PT plan of care cert/re-cert  Weakness - Plan: PT plan of care cert/re-cert  Right shoulder pain - Plan: PT plan of care cert/re-cert  Muscle weakness of right upper extremity - Plan: PT plan of care cert/re-cert                                    PT Long Term Goals - 10/01/15 [id number]    PT LONG TERM GOAL #1   Title Ind with a HEP.   Time 6   Period Weeks   Status On-going   PT LONG TERM GOAL #2   Title Active right shoulder flexion to 155 degrees in standing   Period Weeks   Status Achieved  AROM 155 degrees (10/01/15)   PT LONG TERM GOAL #3   Title Active ER to 80 degrees.   Time 6   Period Weeks   Status On-going   PT LONG TERM GOAL #4   Title Patient reach behind back to T10 with right hand.   Time 6   Period Weeks   Status On-going   PT LONG TERM GOAL #5   Title Right shoulder strength= 5/5.   Time 6   Period Weeks   Status On-going   PT LONG TERM GOAL #6   Title Patient able to perform ADL's with right shoulder  pain not > 2/10.   Time 6   Period Weeks   Status On-going  4-5/10 (10/01/15)     See FLOW sheet for RX          Problem List Patient Active Problem List   Diagnosis Date Noted  . Anxiety and depression 08/24/2015  . Low testosterone 04/22/2015  . GERD (gastroesophageal reflux disease) 04/22/2015  . Barrett's esophagus 04/22/2015  . Essential hypertension, benign 04/22/2015    RAMSEUR,CHRIS, PTA 10/06/2015, 6:09 PM  [facility] [address] [place], [place], [address] Phone: [phone]   Fax:  [phone]  Name: Jeffrey Logan MRN: [id number] Date of Birth: 11/06/1955

## 2015-10-07 ENCOUNTER — Ambulatory Visit: Payer: Worker's Compensation | Admitting: Physical Therapy

## 2015-10-07 ENCOUNTER — Encounter: Payer: Self-pay | Admitting: Physical Therapy

## 2015-10-07 DIAGNOSIS — M25511 Pain in right shoulder: Secondary | ICD-10-CM | POA: Diagnosis not present

## 2015-10-07 DIAGNOSIS — M25611 Stiffness of right shoulder, not elsewhere classified: Secondary | ICD-10-CM

## 2015-10-07 DIAGNOSIS — R531 Weakness: Secondary | ICD-10-CM

## 2015-10-07 NOTE — Therapy (Signed)
[facility] [address] [place], [place], [address] Phone: [phone]   Fax:  [phone]  Physical Therapy Treatment  Patient Details  Name: Jeffrey Logan MRN: [id number] Date of Birth: 1955/11/24 Referring Provider: Marcene Corning, MD  Encounter Date: 10/07/2015      PT End of Session - 10/07/15 0810    Visit Number 13   Number of Visits 36   Date for PT Re-Evaluation 11/02/15   PT Start Time 0815   PT Stop Time 0900   PT Time Calculation (min) 45 min   Activity Tolerance Patient tolerated treatment well   Behavior During Therapy [facility] for tasks assessed/performed      Past Medical History  Diagnosis Date  . Hypertension   . GERD (gastroesophageal reflux disease)   . Sleep apnea 2012    wears a cpap at night    Past Surgical History  Procedure Laterality Date  . Rotator cuff repair  12/25/14    right shoulder  . Lumbar disc surgery    . Lumbar fusion      L5-S1  . Back surgery      screw placement  . Rib fracture surgery  2002    removal of left 9th rib  . Shoulder surgery Right 08/10/2015    There were no vitals filed for this visit.  Visit Diagnosis:  Pain in joint of right shoulder  Stiffness of right shoulder joint  Weakness      Subjective Assessment - 10/07/15 0816    Subjective States that MD said falling may not have helped but to continue PT and return in 4 weeks.   Limitations Lifting   Patient Stated Goals Return right shoulder to previous level of function.   Currently in Pain? Yes   Pain Score 4    Pain Location Shoulder   Pain Orientation Right   Pain Descriptors / Indicators Sore   Pain Type Surgical pain   Pain Onset 1 to 4 weeks ago            [facility] PT Assessment - 10/07/15 0001    Assessment   Medical Diagnosis R shoulder scope   Onset Date/Surgical Date 08/20/15   Next MD Visit 10/2015   AROM   Overall AROM  Deficits   AROM Assessment Site Shoulder   Right/Left Shoulder  Right   Right Shoulder Flexion 157 Degrees  123 deg supine, 125 45 deg recline                     [facility] Adult PT Treatment/Exercise - 10/07/15 0001    Exercises   Exercises Shoulder   Elbow Exercises   Bar Weights/Barbell (Elbow Flexion) 5 lbs  3x10 reps   Shoulder Exercises: Supine   External Rotation AROM;Both;20 reps  45 deg recline   Flexion AROM;Right  x20 reps supine, x20 reps 45 deg recline   Other Supine Exercises R shoulder PNF D2 AROM x5 reps at 45 deg recline  Stopped due to pain in anterior R shoulder   Shoulder Exercises: Seated   Retraction AROM;Both  3x10 reps   Shoulder Exercises: Standing   Protraction Strengthening;Right;Theraband  3x10 reps   Theraband Level (Shoulder Protraction) Level 2 (Red)   External Rotation Strengthening;Right;Theraband  3x10 reps   Theraband Level (Shoulder External Rotation) Level 2 (Red)   Internal Rotation Strengthening;Right;Theraband  3x10 reps   Theraband Level (Shoulder Internal Rotation) Level 2 (Red)   Row Strengthening;Right;Theraband  3x10 reps   Theraband Level (  Shoulder Row) Level 2 (Red)   Shoulder Exercises: Pulleys   Other Pulley Exercises UE ranger for elevation and circles 3x10 each   Shoulder Exercises: ROM/Strengthening   UBE (Upper Arm Bike) 120 RPM x6 min   Modalities   Modalities Ultrasound;Iontophoresis   Ultrasound   Ultrasound Location R AC Joint   Ultrasound Parameters 1.0 w/cm2, 100%,1 mhz x10 min   Ultrasound Goals Pain   Iontophoresis   Type of Iontophoresis Dexamethasone   Location R superior shoulder   Dose 2.0 ml 1 of 6   Time 5                     PT Long Term Goals - 10/01/15 6010    PT LONG TERM GOAL #1   Title Ind with a HEP.   Time 6   Period Weeks   Status On-going   PT LONG TERM GOAL #2   Title Active right shoulder flexion to 155 degrees in standing   Period Weeks   Status Achieved  AROM 155 degrees (10/01/15)   PT LONG TERM GOAL #3   Title  Active ER to 80 degrees.   Time 6   Period Weeks   Status On-going   PT LONG TERM GOAL #4   Title Patient reach behind back to T10 with right hand.   Time 6   Period Weeks   Status On-going   PT LONG TERM GOAL #5   Title Right shoulder strength= 5/5.   Time 6   Period Weeks   Status On-going   PT LONG TERM GOAL #6   Title Patient able to perform ADL's with right shoulder pain not > 2/10.   Time 6   Period Weeks   Status On-going  4-5/10 (10/01/15)               Plan - 10/07/15 0901    Clinical Impression Statement Patient tolerated today's treatment fairly well although with he reported discomfort with intermittant exercises. Patient able to increase resistance to theraband strengthening to red theraband but noted that he could "tell it." Patient demonstrated better R shoulder flexion in positions that puts him against gravity such as standing or 45 deg recline. MPT approved [place] and iontophoresis as directed by MD order. Normal [place] response noted following end of the [place] session. Iontophoresis patch placed over R superior shoulder around [facility].   Pt will benefit from skilled therapeutic intervention in order to improve on the following deficits Decreased range of motion;Pain;Decreased strength;Impaired flexibility;Postural dysfunction;Decreased activity tolerance   Rehab Potential Excellent   Clinical Impairments Affecting Rehab Potential RCR surgery 12/25/2014, ACJ surgery 08/20/2015   PT Frequency 2x / week   PT Duration 6 weeks   PT Treatment/Interventions ADLs/Self Care Home Management;Cryotherapy;Electrical Stimulation;Moist Heat;Therapeutic exercise;Manual techniques;Taping;Dry needling;Ultrasound;Neuromuscular re-education;Patient/family education;Passive range of motion;Iontophoresis /ml Dexamethasone   PT Next Visit Plan Continue per MPT POC   PT Home Exercise Plan towel stretch for IR, wall stretch for flexion   Consulted and Agree with Plan of Care Patient         Problem List Patient Active Problem List   Diagnosis Date Noted  . Anxiety and depression 08/24/2015  . Low testosterone 04/22/2015  . GERD (gastroesophageal reflux disease) 04/22/2015  . Barrett's esophagus 04/22/2015  . Essential hypertension, benign 04/22/2015    Evelene Croon, PTA 10/07/2015, 9:13 AM  [facility] [address] [place], [place], [address] Phone: [phone]   Fax:  [phone]  Name: Jeffrey Logan MRN: [id number] Date of Birth: 04/08/1956

## 2015-10-08 ENCOUNTER — Encounter: Payer: Self-pay | Admitting: Physical Therapy

## 2015-10-08 ENCOUNTER — Ambulatory Visit: Payer: Worker's Compensation | Admitting: Physical Therapy

## 2015-10-08 DIAGNOSIS — M25511 Pain in right shoulder: Secondary | ICD-10-CM

## 2015-10-08 DIAGNOSIS — M25611 Stiffness of right shoulder, not elsewhere classified: Secondary | ICD-10-CM

## 2015-10-08 DIAGNOSIS — R531 Weakness: Secondary | ICD-10-CM

## 2015-10-08 NOTE — Therapy (Signed)
[facility] [address] [place], [place], [address] Phone: [phone]   Fax:  [phone]  Physical Therapy Treatment  Patient Details  Name: Jeffrey Logan MRN: [id number] Date of Birth: 05/04/1956 Referring Provider: Marcene CorningPeter Dalldorf, MD  Encounter Date: 10/08/2015      PT End of Session - 10/08/15 0844    Visit Number 14   Number of Visits 36   Date for PT Re-Evaluation 11/02/15   PT Start Time 0816   PT Stop Time 0858   PT Time Calculation (min) 42 min   Activity Tolerance Patient tolerated treatment well   Behavior During Therapy [facility] for tasks assessed/performed      Past Medical History  Diagnosis Date  . Hypertension   . GERD (gastroesophageal reflux disease)   . Sleep apnea 2012    wears a cpap at night    Past Surgical History  Procedure Laterality Date  . Rotator cuff repair  12/25/14    right shoulder  . Lumbar disc surgery    . Lumbar fusion      L5-S1  . Back surgery      screw placement  . Rib fracture surgery  2002    removal of left 9th rib  . Shoulder surgery Right 08/10/2015    There were no vitals filed for this visit.  Visit Diagnosis:  Pain in joint of right shoulder  Stiffness of right shoulder joint  Weakness  Right shoulder pain      Subjective Assessment - 10/08/15 0818    Subjective Patient reports continued soreness in shoulder   Limitations Lifting   Patient Stated Goals Return right shoulder to previous level of function.   Currently in Pain? Yes   Pain Score 4    Pain Location Shoulder   Pain Orientation Right   Pain Descriptors / Indicators Sore   Pain Type Surgical pain   Pain Onset 1 to 4 weeks ago   Pain Frequency Constant   Aggravating Factors  activity with shoulder   Pain Relieving Factors rest                         [facility] Adult PT Treatment/Exercise - 10/08/15 0001    Shoulder Exercises: Standing   Protraction Strengthening;Right;Theraband   3x10   Theraband Level (Shoulder Protraction) Level 2 (Red)   External Rotation Strengthening;Right;Theraband  3x10   Theraband Level (Shoulder External Rotation) Level 2 (Red)   Internal Rotation Strengthening;Right;Theraband   Theraband Level (Shoulder Internal Rotation) Level 2 (Red)  3x10   Row Strengthening;Right;Theraband  3x10   Theraband Level (Shoulder Row) Level 2 (Red)   Shoulder Exercises: Pulleys   Flexion 3 minutes   Other Pulley Exercises UE ranger for elevation and circles 3x10 each   Shoulder Exercises: ROM/Strengthening   UBE (Upper Arm Bike) 120 RPM x6 min   Ultrasound   Ultrasound Location Rt AC Joint   Ultrasound Parameters 1.0w/cm2/50%/21mhz x498min   Ultrasound Goals Pain   Iontophoresis   Type of Iontophoresis Dexamethasone   Location R superior shoulder   Dose 2.0 ml 2 of 6   Time 8                     PT Long Term Goals - 10/01/15 [id number]    PT LONG TERM GOAL #1   Title Ind with a HEP.   Time 6   Period Weeks   Status On-going   PT LONG TERM GOAL #  2   Title Active right shoulder flexion to 155 degrees in standing   Period Weeks   Status Achieved  AROM 155 degrees (10/01/15)   PT LONG TERM GOAL #3   Title Active ER to 80 degrees.   Time 6   Period Weeks   Status On-going   PT LONG TERM GOAL #4   Title Patient reach behind back to T10 with right hand.   Time 6   Period Weeks   Status On-going   PT LONG TERM GOAL #5   Title Right shoulder strength= 5/5.   Time 6   Period Weeks   Status On-going   PT LONG TERM GOAL #6   Title Patient able to perform ADL's with right shoulder pain not > 2/10.   Time 6   Period Weeks   Status On-going  4-5/10 (10/01/15)               Plan - 10/08/15 0847    Clinical Impression Statement Patient progressing with all exercises and no increase of pain over 4/10. Patient has good technique with exercises and able to tolerate stronger weight. Patient has responded well to ionto thus far.  Unable to meet any further goals due to pain, ROM and strength deficits.   Pt will benefit from skilled therapeutic intervention in order to improve on the following deficits Decreased range of motion;Pain;Decreased strength;Impaired flexibility;Postural dysfunction;Decreased activity tolerance   Rehab Potential Excellent   Clinical Impairments Affecting Rehab Potential RCR surgery 12/25/2014, ACJ surgery 08/20/2015   PT Frequency 2x / week   PT Duration 6 weeks   PT Treatment/Interventions ADLs/Self Care Home Management;Cryotherapy;Electrical Stimulation;Moist Heat;Therapeutic exercise;Manual techniques;Taping;Dry needling;Ultrasound;Neuromuscular re-education;Patient/family education;Passive range of motion;Iontophoresis /ml Dexamethasone   PT Next Visit Plan Continue per MPT POC   Consulted and Agree with Plan of Care Patient        Problem List Patient Active Problem List   Diagnosis Date Noted  . Anxiety and depression 08/24/2015  . Low testosterone 04/22/2015  . GERD (gastroesophageal reflux disease) 04/22/2015  . Barrett's esophagus 04/22/2015  . Essential hypertension, benign 04/22/2015    [name], PTA 10/08/2015, 8:58 AM  [facility] [address] [place], [place], [address] Phone: [phone]   Fax:  [phone]  Name: Kinsley Nicklaus MRN: [id number] Date of Birth: 1955-09-13

## 2015-10-12 ENCOUNTER — Encounter: Payer: Self-pay | Admitting: Physical Therapy

## 2015-10-12 ENCOUNTER — Ambulatory Visit: Payer: Worker's Compensation | Admitting: Physical Therapy

## 2015-10-12 DIAGNOSIS — R531 Weakness: Secondary | ICD-10-CM

## 2015-10-12 DIAGNOSIS — M25511 Pain in right shoulder: Secondary | ICD-10-CM

## 2015-10-12 DIAGNOSIS — M25611 Stiffness of right shoulder, not elsewhere classified: Secondary | ICD-10-CM

## 2015-10-12 NOTE — Therapy (Signed)
[facility] [address] [place], [place], [address] Phone: [phone]   Fax:  [phone]  Physical Therapy Treatment  Patient Details  Name: Jeffrey Logan MRN: [id number] Date of Birth: 10/21/1955 Referring Provider: Marcene CorningPeter Dalldorf, MD  Encounter Date: 10/12/2015      PT End of Session - 10/12/15 0748    Visit Number 15   Number of Visits 36   Date for PT Re-Evaluation 11/02/15   PT Start Time 0728   PT Stop Time 0812   PT Time Calculation (min) 44 min   Activity Tolerance Patient tolerated treatment well   Behavior During Therapy [facility] for tasks assessed/performed      Past Medical History  Diagnosis Date  . Hypertension   . GERD (gastroesophageal reflux disease)   . Sleep apnea 2012    wears a cpap at night    Past Surgical History  Procedure Laterality Date  . Rotator cuff repair  12/25/14    right shoulder  . Lumbar disc surgery    . Lumbar fusion      L5-S1  . Back surgery      screw placement  . Rib fracture surgery  2002    removal of left 9th rib  . Shoulder surgery Right 08/10/2015    There were no vitals filed for this visit.  Visit Diagnosis:  Pain in joint of right shoulder  Stiffness of right shoulder joint  Weakness      Subjective Assessment - 10/12/15 0733    Subjective Patient reported continued pain in [facility]C Joint area of right shoulder   Limitations Lifting   Patient Stated Goals Return right shoulder to previous level of function.   Currently in Pain? Yes   Pain Score 5    Pain Location Shoulder   Pain Orientation Right   Pain Descriptors / Indicators Throbbing;Sore   Pain Type Surgical pain   Pain Onset 1 to 4 weeks ago   Pain Frequency Constant   Aggravating Factors  increased activity   Pain Relieving Factors rest                         [facility] Adult PT Treatment/Exercise - 10/12/15 0001    Shoulder Exercises: Sidelying   External Rotation  Strengthening;Right;Weights  3x10   External Rotation Weight (lbs) 2   Shoulder Exercises: Standing   Protraction Strengthening;Right;Theraband  3x10   Theraband Level (Shoulder Protraction) Level 2 (Red)   External Rotation Strengthening;Right;Theraband  3x10   Theraband Level (Shoulder External Rotation) Level 2 (Red)   Internal Rotation Strengthening;Right;Theraband  3x10   Theraband Level (Shoulder Internal Rotation) Level 2 (Red)   Row Strengthening;Right;Theraband  3x10   Theraband Level (Shoulder Row) Level 2 (Red)   Shoulder Exercises: Pulleys   Flexion 3 minutes   Other Pulley Exercises UE ranger for elevation and circles 3x10 each   Shoulder Exercises: ROM/Strengthening   UBE (Upper Arm Bike) 120 RPM x6 min   Ultrasound   Ultrasound Location right AC Joint   Ultrasound Parameters 1.0w/cm2/50%/3.673mhz x268min   Ultrasound Goals Pain   Iontophoresis   Type of Iontophoresis Dexamethasone   Location R superior shoulder   Dose 2.0 ml 3 of 6   Time 8                     PT Long Term Goals - 10/01/15 [id number]    PT LONG TERM GOAL #1   Title Ind with a  HEP.   Time 6   Period Weeks   Status On-going   PT LONG TERM GOAL #2   Title Active right shoulder flexion to 155 degrees in standing   Period Weeks   Status Achieved  AROM 155 degrees (10/01/15)   PT LONG TERM GOAL #3   Title Active ER to 80 degrees.   Time 6   Period Weeks   Status On-going   PT LONG TERM GOAL #4   Title Patient reach behind back to T10 with right hand.   Time 6   Period Weeks   Status On-going   PT LONG TERM GOAL #5   Title Right shoulder strength= 5/5.   Time 6   Period Weeks   Status On-going   PT LONG TERM GOAL #6   Title Patient able to perform ADL's with right shoulder pain not > 2/10.   Time 6   Period Weeks   Status On-going  4-5/10 (10/01/15)               Plan - 10/12/15 0751    Clinical Impression Statement Patient progressing with continued pain in right [facility]  Joint for unknown reason. Patient unble to progress with strengthening due to throbbing pain in shoulder. Patient responds well to modalities and has relief after. Patient unable to meet any further goals due to ROM, pain and strength deficits.   Pt will benefit from skilled therapeutic intervention in order to improve on the following deficits Decreased range of motion;Pain;Decreased strength;Impaired flexibility;Postural dysfunction;Decreased activity tolerance   Rehab Potential Excellent   Clinical Impairments Affecting Rehab Potential RCR surgery 12/25/2014, ACJ surgery 08/20/2015   PT Frequency 2x / week   PT Duration 6 weeks   PT Treatment/Interventions ADLs/Self Care Home Management;Cryotherapy;Electrical Stimulation;Moist Heat;Therapeutic exercise;Manual techniques;Taping;Dry needling;Ultrasound;Neuromuscular re-education;Patient/family education;Passive range of motion;Iontophoresis /ml Dexamethasone   PT Next Visit Plan Continue per MPT POC   Consulted and Agree with Plan of Care Patient        Problem List Patient Active Problem List   Diagnosis Date Noted  . Anxiety and depression 08/24/2015  . Low testosterone 04/22/2015  . GERD (gastroesophageal reflux disease) 04/22/2015  . Barrett's esophagus 04/22/2015  . Essential hypertension, benign 04/22/2015    [name], PTA 10/12/2015, 8:19 AM  [facility] [address] [place], [place], [address] Phone: [phone]   Fax:  [phone]  Name: Jeffrey Logan MRN: [id number] Date of Birth: 05/18/1956

## 2015-10-14 ENCOUNTER — Encounter: Payer: Self-pay | Admitting: Physical Therapy

## 2015-10-14 ENCOUNTER — Ambulatory Visit: Payer: Worker's Compensation | Admitting: Physical Therapy

## 2015-10-14 DIAGNOSIS — M25611 Stiffness of right shoulder, not elsewhere classified: Secondary | ICD-10-CM

## 2015-10-14 DIAGNOSIS — M25511 Pain in right shoulder: Secondary | ICD-10-CM

## 2015-10-14 DIAGNOSIS — R531 Weakness: Secondary | ICD-10-CM

## 2015-10-14 NOTE — Therapy (Signed)
[facility] [address] [place], [place], [address] Phone: [phone]   Fax:  [phone]  Physical Therapy Treatment  Patient Details  Name: [place] MRN: [id number] Date of Birth: June 02, 1956 Referring Provider: Marcene Corning, MD  Encounter Date: 10/14/2015      PT End of Session - 10/14/15 0900    Visit Number 16   Number of Visits 36   Date for PT Re-Evaluation 11/02/15   PT Start Time 0858   PT Stop Time 0940   PT Time Calculation (min) 42 min   Activity Tolerance Patient tolerated treatment well   Behavior During Therapy [facility] for tasks assessed/performed      Past Medical History  Diagnosis Date  . Hypertension   . GERD (gastroesophageal reflux disease)   . Sleep apnea 2012    wears a cpap at night    Past Surgical History  Procedure Laterality Date  . Rotator cuff repair  12/25/14    right shoulder  . Lumbar disc surgery    . Lumbar fusion      L5-S1  . Back surgery      screw placement  . Rib fracture surgery  2002    removal of left 9th rib  . Shoulder surgery Right 08/10/2015    There were no vitals filed for this visit.  Visit Diagnosis:  Pain in joint of right shoulder  Stiffness of right shoulder joint  Weakness      Subjective Assessment - 10/14/15 0859    Subjective States that shoulder is "there" and still sore. Reports that some days his shoulder has decreased soreness and is able to move shoulder.   Limitations Lifting   Patient Stated Goals Return right shoulder to previous level of function.   Currently in Pain? Yes   Pain Score 4    Pain Location Shoulder   Pain Orientation Right   Pain Descriptors / Indicators Sore   Pain Type Surgical pain   Pain Onset 1 to 4 weeks ago            [facility] PT Assessment - 10/14/15 0001    Assessment   Medical Diagnosis R shoulder scope   Onset Date/Surgical Date 08/20/15   Next MD Visit 11/02/2015                     [facility]  Adult PT Treatment/Exercise - 10/14/15 0001    Shoulder Exercises: Supine   Horizontal ABduction Strengthening;Both;Theraband  3x10 reps   Theraband Level (Shoulder Horizontal ABduction) Level 1 (Yellow)   Shoulder Exercises: Sidelying   External Rotation Strengthening;Right;Weights  3x10 reps   External Rotation Weight (lbs) 2   Shoulder Exercises: Standing   Protraction Strengthening;Right;Theraband  3x10 reps   Theraband Level (Shoulder Protraction) Level 2 (Red)   External Rotation Strengthening;Right;Theraband  3x10 reps   Theraband Level (Shoulder External Rotation) Level 2 (Red)   Internal Rotation Strengthening;Right;Theraband   Theraband Level (Shoulder Internal Rotation) Level 2 (Red)   Row Strengthening;Right;Theraband  3x10 reps   Theraband Level (Shoulder Row) Level 2 (Red)   Shoulder Exercises: Pulleys   Flexion 3 minutes   Other Pulley Exercises UE ranger for elevation and circles 3x10 each   Shoulder Exercises: ROM/Strengthening   UBE (Upper Arm Bike) 120 RPM x6 min   Modalities   Modalities Ultrasound;Iontophoresis   Ultrasound   Ultrasound Location R AC Joint   Ultrasound Parameters 1.0 w/cm2, 50%, 3.3 mhz x10 min   Ultrasound Goals Pain  Iontophoresis   Type of Iontophoresis Dexamethasone   Location R superior shoulder   Dose 2.0 ml 4 of 6   Time 8                     PT Long Term Goals - 10/01/15 16100822    PT LONG TERM GOAL #1   Title Ind with a HEP.   Time 6   Period Weeks   Status On-going   PT LONG TERM GOAL #2   Title Active right shoulder flexion to 155 degrees in standing   Period Weeks   Status Achieved  AROM 155 degrees (10/01/15)   PT LONG TERM GOAL #3   Title Active ER to 80 degrees.   Time 6   Period Weeks   Status On-going   PT LONG TERM GOAL #4   Title Patient reach behind back to T10 with right hand.   Time 6   Period Weeks   Status On-going   PT LONG TERM GOAL #5   Title Right shoulder strength= 5/5.   Time 6    Period Weeks   Status On-going   PT LONG TERM GOAL #6   Title Patient able to perform ADL's with right shoulder pain not > 2/10.   Time 6   Period Weeks   Status On-going  4-5/10 (10/01/15)               Plan - 10/14/15 0942    Clinical Impression Statement Patient continues to present with soreness around the R AC joint and facial grimacing was not noted with any exercises other than UE ranger with big circles. Continues to tolerate L shoulder strengthening exercises with red theraband with good technique. Scapular retractors strengthening exercises intiated today to strengthening postural muscles to decrease strain on L AC joint. Normal US response noted following end of the US session to R superior shoulder region. Fourth ionotphoresis patch placed over R superior shoulder region today. Goals remain on-going secondary to limitations with R shoulder ROM and strength as well as pain with ADLs. Patient was commnicating regarding ADLs in clinic and stated that sometimes he can raise his shoulder higher with R shoulder abduction with elbow extended but states he has issues with abductions and elbow flexed.   Pt will benefit from skilled therapeutic intervention in order to improve on the following deficits Decreased range of motion;Pain;Decreased strength;Impaired flexibility;Postural dysfunction;Decreased activity tolerance   Rehab Potential Excellent   Clinical Impairments Affecting Rehab Potential RCR surgery 12/25/2014, ACJ surgery 08/20/2015   PT Frequency 2x / week   PT Duration 6 weeks   PT Treatment/Interventions ADLs/Self Care Home Management;Cryotherapy;Electrical Stimulation;Moist Heat;Therapeutic exercise;Manual techniques;Taping;Dry needling;Ultrasound;Neuromuscular re-education;Patient/family education;Passive range of motion;Iontophoresis 4mg /ml Dexamethasone   PT Next Visit Plan Continue per MPT POC   PT Home Exercise Plan towel stretch for IR, wall stretch for flexion    Consulted and Agree with Plan of Care Patient        Problem List Patient Active Problem List   Diagnosis Date Noted  . Anxiety and depression 08/24/2015  . Low testosterone 04/22/2015  . GERD (gastroesophageal reflux disease) 04/22/2015  . Barrett's esophagus 04/22/2015  . Essential hypertension, benign 04/22/2015    Evelene CroonKelsey M Parsons, PTA 10/14/2015, 9:50 AM  [facility] [address] [place], [place], [address] Phone: [phone]   Fax:  [phone]  Name: Elizebeth Brookingllen Basden MRN: [id number] Date of Birth: 11/17/1955

## 2015-10-16 ENCOUNTER — Ambulatory Visit: Payer: Worker's Compensation | Admitting: *Deleted

## 2015-10-16 DIAGNOSIS — M25611 Stiffness of right shoulder, not elsewhere classified: Secondary | ICD-10-CM

## 2015-10-16 DIAGNOSIS — M25511 Pain in right shoulder: Secondary | ICD-10-CM

## 2015-10-16 DIAGNOSIS — R531 Weakness: Secondary | ICD-10-CM

## 2015-10-16 NOTE — Therapy (Signed)
[facility] [address] [place], [place], [address] Phone: [phone]   Fax:  [phone]  Physical Therapy Treatment  Patient Details  Name: [place] MRN: [id number] Date of Birth: 07-Nov-1955 Referring Provider: Marcene Corning, MD  Encounter Date: 10/16/2015      PT End of Session - 10/16/15 0821    Visit Number 17   Number of Visits 36   Date for PT Re-Evaluation 11/02/15   PT Start Time 0815   PT Stop Time 0905   PT Time Calculation (min) 50 min      Past Medical History  Diagnosis Date  . Hypertension   . GERD (gastroesophageal reflux disease)   . Sleep apnea 2012    wears a cpap at night    Past Surgical History  Procedure Laterality Date  . Rotator cuff repair  12/25/14    right shoulder  . Lumbar disc surgery    . Lumbar fusion      L5-S1  . Back surgery      screw placement  . Rib fracture surgery  2002    removal of left 9th rib  . Shoulder surgery Right 08/10/2015    There were no vitals filed for this visit.  Visit Diagnosis:  Pain in joint of right shoulder  Stiffness of right shoulder joint  Weakness      Subjective Assessment - 10/16/15 0820    Subjective States that shoulder is "there" and still sore. Reports that some days his shoulder has decreased soreness and is able to move shoulder. 3/10 today   Limitations Lifting   Patient Stated Goals Return right shoulder to previous level of function.   Currently in Pain? Yes   Pain Score 3    Pain Location Shoulder   Pain Orientation Right   Pain Descriptors / Indicators Sore;Aching   Pain Type Surgical pain   Pain Onset 1 to 4 weeks ago   Pain Frequency Constant            [facility] PT Assessment - 10/16/15 0001    AROM   Right/Left Shoulder Right   Right Shoulder Flexion 157 Degrees   Right Shoulder External Rotation 70 Degrees                     [facility] Adult PT Treatment/Exercise - 10/16/15 0001    Exercises   Exercises Shoulder   Elbow Exercises   Bar Weights/Barbell (Elbow Flexion) 5 lbs  3x10 reps   Shoulder Exercises: Standing   External Rotation Strengthening;Right;Theraband  3x10 reps   Theraband Level (Shoulder External Rotation) Level 2 (Red)   Internal Rotation Strengthening;Right;Theraband   Theraband Level (Shoulder Internal Rotation) Level 2 (Red)   Flexion Strengthening;Right;10 reps  3x10   Shoulder Flexion Weight (lbs) 2#   Other Standing Exercises 6# ball raises 3x10   Shoulder Exercises: Pulleys   Flexion 3 minutes   Shoulder Exercises: ROM/Strengthening   UBE (Upper Arm Bike) 90  RPM x6 min   Modalities   Modalities Ultrasound;Iontophoresis   Ultrasound   Ultrasound Location RT ACJ   Ultrasound Parameters 1 w/cm2 x 8 mins 50% , 3.86mhz   Ultrasound Goals Pain   Iontophoresis   Type of Iontophoresis Dexamethasone   Location R superior shoulder   Dose 2.0 ml 5 of 6   Time 8                     PT Long Term Goals - 10/01/15  [id number]    PT LONG TERM GOAL #1   Title Ind with a HEP.   Time 6   Period Weeks   Status On-going   PT LONG TERM GOAL #2   Title Active right shoulder flexion to 155 degrees in standing   Period Weeks   Status Achieved  AROM 155 degrees (10/01/15)   PT LONG TERM GOAL #3   Title Active ER to 80 degrees.   Time 6   Period Weeks   Status On-going   PT LONG TERM GOAL #4   Title Patient reach behind back to T10 with right hand.   Time 6   Period Weeks   Status On-going   PT LONG TERM GOAL #5   Title Right shoulder strength= 5/5.   Time 6   Period Weeks   Status On-going   PT LONG TERM GOAL #6   Title Patient able to perform ADL's with right shoulder pain not > 2/10.   Time 6   Period Weeks   Status On-going  4-5/10 (10/01/15)               Plan - 10/16/15 [id number]    Clinical Impression Statement Pt did fairly well with Rx today. He was able to perform Therex for RT shldr with minimal pain increase. He still continues  to have some sorness and popping inRT ACJ with ADLs, but better today. His ROM is about the same, but progressing with strength and act.'s. Normal response after US. Goals on-going   Pt will benefit from skilled therapeutic intervention in order to improve on the following deficits Decreased range of motion;Pain;Decreased strength;Impaired flexibility;Postural dysfunction;Decreased activity tolerance   Clinical Impairments Affecting Rehab Potential RCR surgery 12/25/2014, ACJ surgery 08/20/2015   PT Frequency 2x / week   PT Duration 6 weeks   PT Treatment/Interventions ADLs/Self Care Home Management;Cryotherapy;Electrical Stimulation;Moist Heat;Therapeutic exercise;Manual techniques;Taping;Dry needling;Ultrasound;Neuromuscular re-education;Patient/family education;Passive range of motion;Iontophoresis 4mg /ml Dexamethasone   PT Next Visit Plan Continue per MPT POC   PT Home Exercise Plan towel stretch for IR, wall stretch for flexion   Consulted and Agree with Plan of Care Patient        Problem List Patient Active Problem List   Diagnosis Date Noted  . Anxiety and depression 08/24/2015  . Low testosterone 04/22/2015  . GERD (gastroesophageal reflux disease) 04/22/2015  . Barrett's esophagus 04/22/2015  . Essential hypertension, benign 04/22/2015    Cass Vandermeulen,CHRIS, PTA 10/16/2015, 9:16 AM  [facility] [address] [place], [place], [address] Phone: [phone]   Fax:  [phone]  Name: Elizebeth Brookingllen Quesnell MRN: [id number] Date of Birth: 09/18/1955

## 2015-10-20 ENCOUNTER — Ambulatory Visit: Payer: Worker's Compensation | Admitting: Physical Therapy

## 2015-10-20 ENCOUNTER — Encounter: Payer: Self-pay | Admitting: Physical Therapy

## 2015-10-20 DIAGNOSIS — R531 Weakness: Secondary | ICD-10-CM

## 2015-10-20 DIAGNOSIS — M25511 Pain in right shoulder: Secondary | ICD-10-CM | POA: Diagnosis not present

## 2015-10-20 DIAGNOSIS — M25611 Stiffness of right shoulder, not elsewhere classified: Secondary | ICD-10-CM

## 2015-10-20 NOTE — Therapy (Signed)
[facility] [address] [place], [place], [address] Phone: [phone]   Fax:  [phone]  Physical Therapy Treatment  Patient Details  Name: Jeffrey [place] MRN: [id number] Date of Birth: 1956/06/02 Referring Provider: Marcene Corning, MD  Encounter Date: 10/20/2015      PT End of Session - 10/20/15 0816    Visit Number 18   Number of Visits 36   Date for PT Re-Evaluation 11/02/15   PT Start Time 0815   PT Stop Time 0906   PT Time Calculation (min) 51 min   Activity Tolerance Patient tolerated treatment well   Behavior During Therapy [facility] for tasks assessed/performed      Past Medical History  Diagnosis Date  . Hypertension   . GERD (gastroesophageal reflux disease)   . Sleep apnea 2012    wears a cpap at night    Past Surgical History  Procedure Laterality Date  . Rotator cuff repair  12/25/14    right shoulder  . Lumbar disc surgery    . Lumbar fusion      L5-S1  . Back surgery      screw placement  . Rib fracture surgery  2002    removal of left 9th rib  . Shoulder surgery Right 08/10/2015    There were no vitals filed for this visit.  Visit Diagnosis:  Pain in joint of right shoulder  Stiffness of right shoulder joint  Weakness      Subjective Assessment - 10/20/15 0817    Subjective States he has been doing home improvement at home such as refacing cabinets and fixing cabinetry in mudroom which she states aggravates his shoulder. Normal actvities such as taking trash out and cooking is going okay.   Limitations Lifting   Patient Stated Goals Return right shoulder to previous level of function.   Currently in Pain? Yes   Pain Score 3    Pain Location Shoulder   Pain Orientation Right   Pain Type Surgical pain   Pain Onset 1 to 4 weeks ago            [facility] PT Assessment - 10/20/15 0001    Assessment   Medical Diagnosis R shoulder scope   Onset Date/Surgical Date 08/20/15   Next MD Visit 11/02/2015                      [facility] Adult PT Treatment/Exercise - 10/20/15 0001    Shoulder Exercises: Standing   Protraction Strengthening;Right;Theraband  3x10 reps   Theraband Level (Shoulder Protraction) Level 2 (Red)   External Rotation Strengthening;Right;Theraband  3x10 reps   Theraband Level (Shoulder External Rotation) Level 2 (Red)   Internal Rotation Strengthening;Right;Theraband  3x10 reps   Theraband Level (Shoulder Internal Rotation) Level 2 (Red)   Flexion Strengthening;Right;Weights  3x10 reps   Shoulder Flexion Weight (lbs) 2   Row Strengthening;Right;Theraband  3x10 reps   Theraband Level (Shoulder Row) Level 2 (Red)   Other Standing Exercises 6# ball raises 3x10   Shoulder Exercises: Pulleys   Flexion Other (comment)  x 5 min   Other Pulley Exercises UE ranger for elevation and circles 3x10 each   Shoulder Exercises: ROM/Strengthening   UBE (Upper Arm Bike) 120 RPM x6 min   Modalities   Modalities Ultrasound;Iontophoresis   Ultrasound   Ultrasound Location R AC Joint   Ultrasound Parameters 1.0 w/cm2, 3.3 mhz x10 min   Ultrasound Goals Pain   Iontophoresis   Type of Iontophoresis Dexamethasone  Location R superior shoulder   Dose 1.0 ml 6/6   Time 8                     PT Long Term Goals - 10/01/15 [id number]    PT LONG TERM GOAL #1   Title Ind with a HEP.   Time 6   Period Weeks   Status On-going   PT LONG TERM GOAL #2   Title Active right shoulder flexion to 155 degrees in standing   Period Weeks   Status Achieved  AROM 155 degrees (10/01/15)   PT LONG TERM GOAL #3   Title Active ER to 80 degrees.   Time 6   Period Weeks   Status On-going   PT LONG TERM GOAL #4   Title Patient reach behind back to T10 with right hand.   Time 6   Period Weeks   Status On-going   PT LONG TERM GOAL #5   Title Right shoulder strength= 5/5.   Time 6   Period Weeks   Status On-going   PT LONG TERM GOAL #6   Title Patient able to perform ADL's  with right shoulder pain not > 2/10.   Time 6   Period Weeks   Status On-going  4-5/10 (10/01/15)               Plan - 10/20/15 0907    Clinical Impression Statement Patient tolerated today's treatment well and only reported discomfort in R shoulder with 6# shoulder reaches as patient reported experiencing "a little" pain. Patient demonstrated improved R shoulder flexion today in clinic although he states his greatest diffiiculty is with reaching. Normal US response noted following end of US session. 6/6 iontophoresis patch was placed over R superior shoulder today. Patient did not give numerical pain rating for R shoulder following today's treatment upon inquiry by PTA.   Pt will benefit from skilled therapeutic intervention in order to improve on the following deficits Decreased range of motion;Pain;Decreased strength;Impaired flexibility;Postural dysfunction;Decreased activity tolerance   Rehab Potential Excellent   Clinical Impairments Affecting Rehab Potential RCR surgery 12/25/2014, ACJ surgery 08/20/2015   PT Frequency 2x / week   PT Duration 6 weeks   PT Treatment/Interventions ADLs/Self Care Home Management;Cryotherapy;Electrical Stimulation;Moist Heat;Therapeutic exercise;Manual techniques;Taping;Dry needling;Ultrasound;Neuromuscular re-education;Patient/family education;Passive range of motion;Iontophoresis 4mg /ml Dexamethasone   PT Next Visit Plan Continue per MPT POC   PT Home Exercise Plan towel stretch for IR, wall stretch for flexion   Consulted and Agree with Plan of Care Patient        Problem List Patient Active Problem List   Diagnosis Date Noted  . Anxiety and depression 08/24/2015  . Low testosterone 04/22/2015  . GERD (gastroesophageal reflux disease) 04/22/2015  . Barrett's esophagus 04/22/2015  . Essential hypertension, benign 04/22/2015    Evelene CroonKelsey M Parsons, PTA 10/20/2015, 9:11 AM  [facility] [address] [place], [place], [address] Phone: [phone]   Fax:  [phone]  Name: Jeffrey [place] MRN: [id number] Date of Birth: 09/14/1955

## 2015-10-21 ENCOUNTER — Encounter: Payer: Self-pay | Admitting: Physical Therapy

## 2015-10-21 ENCOUNTER — Ambulatory Visit: Payer: Worker's Compensation | Admitting: Physical Therapy

## 2015-10-21 DIAGNOSIS — M25511 Pain in right shoulder: Secondary | ICD-10-CM | POA: Diagnosis not present

## 2015-10-21 DIAGNOSIS — R531 Weakness: Secondary | ICD-10-CM

## 2015-10-21 DIAGNOSIS — M25611 Stiffness of right shoulder, not elsewhere classified: Secondary | ICD-10-CM

## 2015-10-21 NOTE — Therapy (Signed)
[facility] [place], [place], [address] Phone: [phone]   Fax:  [phone]  Physical Therapy Treatment  Patient Details  Name: Jeffrey [place] MRN: [id number] Date of Birth: 1956/01/04 Referring Provider: Melrose Nakayama, MD  Encounter Date: 10/21/2015      PT End of Session - 10/21/15 0841    Visit Number 19   Number of Visits 36   Date for PT Re-Evaluation 11/02/15   PT Start Time 0814   PT Stop Time 0859   PT Time Calculation (min) 45 min   Activity Tolerance Patient tolerated treatment well   Behavior During Therapy [facility] for tasks assessed/performed      Past Medical History  Diagnosis Date  . Hypertension   . GERD (gastroesophageal reflux disease)   . Sleep apnea 2012    wears a cpap at night    Past Surgical History  Procedure Laterality Date  . Rotator cuff repair  12/25/14    right shoulder  . Lumbar disc surgery    . Lumbar fusion      L5-S1  . Back surgery      screw placement  . Rib fracture surgery  2002    removal of left 9th rib  . Shoulder surgery Right 08/10/2015    There were no vitals filed for this visit.  Visit Diagnosis:  Pain in joint of right shoulder  Stiffness of right shoulder joint  Weakness      Subjective Assessment - 10/21/15 0819    Subjective Patient reported feeling less pain overall, yet some soreness in shoulder   Limitations Lifting   Patient Stated Goals Return right shoulder to previous level of function.   Currently in Pain? Yes   Pain Score 2    Pain Location Shoulder   Pain Orientation Right   Pain Descriptors / Indicators Sore;Aching   Pain Type Surgical pain   Pain Onset 1 to 4 weeks ago   Pain Frequency Constant   Aggravating Factors  increased activity   Pain Relieving Factors rest                         [facility] Adult PT Treatment/Exercise - 10/21/15 0001    Shoulder Exercises: Sidelying   External Rotation  Strengthening;Right;Weights  3x10   External Rotation Weight (lbs) 2   Shoulder Exercises: Standing   Protraction Strengthening;Right;Theraband  3x10   Theraband Level (Shoulder Protraction) Level 2 (Red)   External Rotation Strengthening;Right;Theraband  3x10   Theraband Level (Shoulder External Rotation) Level 2 (Red)   Internal Rotation Strengthening;Right;Theraband  3x10   Theraband Level (Shoulder Internal Rotation) Level 2 (Red)   Flexion Strengthening;Right;Weights  3x10   Shoulder Flexion Weight (lbs) 2   Row Strengthening;Right;Theraband  3x10   Theraband Level (Shoulder Row) Level 2 (Red)   Other Standing Exercises 6# ball raises 3x10   Shoulder Exercises: Pulleys   Flexion 3 minutes   Other Pulley Exercises UE ranger for elevation and circles 3x10 each   Shoulder Exercises: ROM/Strengthening   UBE (Upper Arm Bike) 90 RPM x6 min   Ultrasound   Ultrasound Location right AC joint   Ultrasound Parameters 1.0w/cm2/50%/3.9mz x160m   Ultrasound Goals Pain                     PT Long Term Goals - 10/21/15 0834    PT LONG TERM GOAL #1   Title Ind with a HEP.   Time 6  Period Weeks   Status On-going   PT LONG TERM GOAL #2   Title Active right shoulder flexion to 155 degrees in standing   Time 6   Period Weeks   Status Achieved   PT LONG TERM GOAL #3   Title Active ER to 80 degrees.   Time 6   Period Weeks   Status Achieved  10/21/15   PT LONG TERM GOAL #4   Title Patient reach behind back to T10 with right hand.   Time 6   Period Weeks   Status Achieved  10/21/15   PT LONG TERM GOAL #5   Title Right shoulder strength= 5/5.   Time 6   Period Weeks   Status On-going   PT LONG TERM GOAL #6   Title Patient able to perform ADL's with right shoulder pain not > 2/10.   Time 6   Period Weeks   Status On-going               Plan - 10/21/15 0845    Clinical Impression Statement Patient progressing with all activities. Patient has  improved active ROM in shoulder and less pain overall. Patient continues to report minor soreness with activity. Patient met LTG#3 and #4 today other goals ongoing due to strength and pain deficits.   Pt will benefit from skilled therapeutic intervention in order to improve on the following deficits Decreased range of motion;Pain;Decreased strength;Impaired flexibility;Postural dysfunction;Decreased activity tolerance   Rehab Potential Excellent   Clinical Impairments Affecting Rehab Potential RCR surgery 12/25/2014, ACJ surgery 08/20/2015   PT Frequency 2x / week   PT Duration 6 weeks   PT Treatment/Interventions ADLs/Self Care Home Management;Cryotherapy;Electrical Stimulation;Moist Heat;Therapeutic exercise;Manual techniques;Taping;Dry needling;Ultrasound;Neuromuscular re-education;Patient/family education;Passive range of motion;Iontophoresis 84m/ml Dexamethasone   PT Next Visit Plan Continue per MPT POC   Consulted and Agree with Plan of Care Patient        Problem List Patient Active Problem List   Diagnosis Date Noted  . Anxiety and depression 08/24/2015  . Low testosterone 04/22/2015  . GERD (gastroesophageal reflux disease) 04/22/2015  . Barrett's esophagus 04/22/2015  . Essential hypertension, benign 04/22/2015    DPhillips Climes PTA 10/21/2015, 9:01 AM  [facility] [address]Phone: [phone]  Fax:  [phone] Name: Jeffrey LeviMRN: [id number] of Birth: 1February 12, 1957

## 2015-10-22 ENCOUNTER — Ambulatory Visit: Payer: Managed Care, Other (non HMO) | Admitting: Family Medicine

## 2015-10-23 ENCOUNTER — Ambulatory Visit: Payer: Worker's Compensation | Admitting: *Deleted

## 2015-10-23 DIAGNOSIS — M25511 Pain in right shoulder: Secondary | ICD-10-CM | POA: Diagnosis not present

## 2015-10-23 DIAGNOSIS — M25611 Stiffness of right shoulder, not elsewhere classified: Secondary | ICD-10-CM

## 2015-10-23 DIAGNOSIS — R531 Weakness: Secondary | ICD-10-CM

## 2015-10-23 NOTE — Therapy (Signed)
[facility] [address] [place], [place], [address] Phone: [phone]   Fax:  [phone]  Physical Therapy Treatment  Patient Details  Name: Jeffrey Brookingllen Grantham MRN: [id number] Date of Birth: 10/03/1955 Referring Provider: Marcene CorningPeter Dalldorf, MD  Encounter Date: 10/23/2015      PT End of Session - 10/23/15 0918    Visit Number 20   Number of Visits 36   Date for PT Re-Evaluation 11/02/15   PT Start Time 0815   PT Stop Time 0904   PT Time Calculation (min) 49 min      Past Medical History  Diagnosis Date  . Hypertension   . GERD (gastroesophageal reflux disease)   . Sleep apnea 2012    wears a cpap at night    Past Surgical History  Procedure Laterality Date  . Rotator cuff repair  12/25/14    right shoulder  . Lumbar disc surgery    . Lumbar fusion      L5-S1  . Back surgery      screw placement  . Rib fracture surgery  2002    removal of left 9th rib  . Shoulder surgery Right 08/10/2015    There were no vitals filed for this visit.  Visit Diagnosis:  Pain in joint of right shoulder  Stiffness of right shoulder joint  Weakness      Subjective Assessment - 10/23/15 0818    Subjective Patient reported feeling less pain overall, yet some soreness in shoulder   Patient Stated Goals Return right shoulder to previous level of function.   Currently in Pain? Yes   Pain Score 2    Pain Location Shoulder   Pain Orientation Right   Pain Descriptors / Indicators Aching;Sore   Pain Type Surgical pain   Pain Onset 1 to 4 weeks ago   Pain Frequency Constant   Aggravating Factors  increased activity   Pain Relieving Factors rest                         [facility] Adult PT Treatment/Exercise - 10/23/15 0001    Exercises   Exercises Shoulder;Elbow   Elbow Exercises   Elbow Flexion Strengthening  3x10   Bar Weights/Barbell (Elbow Flexion) 5 lbs   Shoulder Exercises: Sidelying   External Rotation  Strengthening;Right;Weights  3x10   External Rotation Weight (lbs) 2   Shoulder Exercises: Standing   Row Strengthening;Right;Theraband   Theraband Level (Shoulder Row) Level 2 (Red)   Shoulder Elevation Strengthening  5# 3x10 overhead press   Other Standing Exercises 8# ball raises 3x10 both hands, 4# ball RT UE D1 and D2 x10 each   Other Standing Exercises --   Shoulder Exercises: Pulleys   Flexion 3 minutes   Other Pulley Exercises UE ranger for elevation and circles 3x10 each   Shoulder Exercises: ROM/Strengthening   UBE (Upper Arm Bike) 90 RPM x6 min   Modalities   Modalities Ultrasound;Iontophoresis   Ultrasound   Ultrasound Location RT ACJ   Ultrasound Parameters 1 w/cm2 x 8 mins in sitting   Ultrasound Goals Pain                     PT Long Term Goals - 10/21/15 0834    PT LONG TERM GOAL #1   Title Ind with a HEP.   Time 6   Period Weeks   Status On-going   PT LONG TERM GOAL #2   Title Active right shoulder  flexion to 155 degrees in standing   Time 6   Period Weeks   Status Achieved   PT LONG TERM GOAL #3   Title Active ER to 80 degrees.   Time 6   Period Weeks   Status Achieved  10/21/15   PT LONG TERM GOAL #4   Title Patient reach behind back to T10 with right hand.   Time 6   Period Weeks   Status Achieved  10/21/15   PT LONG TERM GOAL #5   Title Right shoulder strength= 5/5.   Time 6   Period Weeks   Status On-going   PT LONG TERM GOAL #6   Title Patient able to perform ADL's with right shoulder pain not > 2/10.   Time 6   Period Weeks   Status On-going               Plan - 10/23/15 0913    Clinical Impression Statement Pt did faiirly well with Rx today. He was able to perform all exs, but still has pain and discomfort in ACJ with some motions ( especially reaching out to side). goals are ongoing   Pt will benefit from skilled therapeutic intervention in order to improve on the following deficits Decreased range of  motion;Pain;Decreased strength;Impaired flexibility;Postural dysfunction;Decreased activity tolerance   Clinical Impairments Affecting Rehab Potential RCR surgery 12/25/2014, ACJ surgery 08/20/2015   PT Frequency 2x / week   PT Treatment/Interventions ADLs/Self Care Home Management;Cryotherapy;Electrical Stimulation;Moist Heat;Therapeutic exercise;Manual techniques;Taping;Dry needling;Ultrasound;Neuromuscular re-education;Patient/family education;Passive range of motion;Iontophoresis /ml Dexamethasone   PT Next Visit Plan Continue per MPT POC   PT Home Exercise Plan towel stretch for IR, wall stretch for flexion   Consulted and Agree with Plan of Care Patient        Problem List Patient Active Problem List   Diagnosis Date Noted  . Anxiety and depression 08/24/2015  . Low testosterone 04/22/2015  . GERD (gastroesophageal reflux disease) 04/22/2015  . Barrett's esophagus 04/22/2015  . Essential hypertension, benign 04/22/2015    RAMSEUR,CHRIS, PTA 10/23/2015, 9:21 AM  [facility] [address] [place], [place], [address] Phone: [phone]   Fax:  [phone]  Name: Javion Holmer MRN: [id number] Date of Birth: 08/14/1955

## 2015-10-26 ENCOUNTER — Encounter: Payer: Self-pay | Admitting: Physical Therapy

## 2015-10-26 ENCOUNTER — Ambulatory Visit: Payer: Worker's Compensation | Admitting: Physical Therapy

## 2015-10-26 DIAGNOSIS — M25511 Pain in right shoulder: Secondary | ICD-10-CM | POA: Diagnosis not present

## 2015-10-26 DIAGNOSIS — R531 Weakness: Secondary | ICD-10-CM

## 2015-10-26 DIAGNOSIS — M25611 Stiffness of right shoulder, not elsewhere classified: Secondary | ICD-10-CM

## 2015-10-26 NOTE — Therapy (Signed)
[facility] [address] [place], [place], [address] Phone: [phone]   Fax:  [phone]  Physical Therapy Treatment  Patient Details  Name: Jeffrey Logan MRN: [id number] Date of Birth: 05/26/1956 Referring Provider: Marcene CorningPeter Dalldorf, MD  Encounter Date: 10/26/2015      PT End of Session - 10/26/15 0816    Visit Number 21   Number of Visits 36   Date for PT Re-Evaluation 11/02/15   PT Start Time 0813   PT Stop Time 0900   PT Time Calculation (min) 47 min   Activity Tolerance Patient tolerated treatment well   Behavior [facility] Therapy [facility] for tasks assessed/performed      Past Medical History  Diagnosis Date  . Hypertension   . GERD (gastroesophageal reflux disease)   . Sleep apnea 2012    wears a cpap at night    Past Surgical History  Procedure Laterality Date  . Rotator cuff repair  12/25/14    right shoulder  . Lumbar disc surgery    . Lumbar fusion      L5-S1  . Back surgery      screw placement  . Rib fracture surgery  2002    removal of left 9th rib  . Shoulder surgery Right 08/10/2015    There were no vitals filed for this visit.  Visit Diagnosis:  Pain in joint of right shoulder  Stiffness of right shoulder joint  Weakness      Subjective Assessment - 10/26/15 0817    Subjective "its just being a pain in my butt." Continues to report completing home improvements around his home recently.   Limitations Lifting   Patient Stated Goals Return right shoulder to previous level of function.   Currently in Pain? Yes   Pain Score 5    Pain Location Shoulder   Pain Orientation Right   Pain Type Surgical pain   Pain Onset 1 to 4 weeks ago            [facility] - 10/26/15 0001    Assessment   Medical Diagnosis R shoulder scope   Onset Date/Surgical Date 08/20/15   Next MD Visit 11/02/2015                     [facility] Treatment/Exercise - 10/26/15 0001    Elbow Exercises    Elbow Flexion Strengthening;Right;Standing;Bar weights/barbell  3X10 reps   Bar Weights/Barbell (Elbow Flexion) 5 lbs   Shoulder Exercises: Sidelying   External Rotation Strengthening;Right;Weights  3x10 reps   External Rotation Weight (lbs) 2   Shoulder Exercises: Standing   Protraction Strengthening;Right;Theraband  3x10 REPS   Theraband Level (Shoulder Protraction) Level 2 (Red)   External Rotation Strengthening;Right;Theraband  3x10 reps   Theraband Level (Shoulder External Rotation) Level 2 (Red)   Internal Rotation Strengthening;Right;Theraband  3x10 reps   Theraband Level (Shoulder Internal Rotation) Level 2 (Red)   Row Strengthening;Right;Theraband  3x10 reps   Theraband Level (Shoulder Row) Level 2 (Red)   Other Standing Exercises 6# ball raises 3x10 reps, 4# ball R D2/D1 x20 reps   Shoulder Exercises: Pulleys   Flexion 3 minutes   Other Pulley Exercises UE ranger for elevation and circles 3x10 each   Shoulder Exercises: ROM/Strengthening   UBE (Upper Arm Bike) 90 RPM x6 min   Modalities   Modalities Ultrasound   Ultrasound   Ultrasound Location R AC joint   Ultrasound Parameters 1.0 w/cm2, 100%, 3.3 mhz x10 min  Ultrasound Goals Pain                     PT Long Term Goals - 10/21/15 0834    PT LONG TERM GOAL #1   Title Ind with a HEP.   Time 6   Period Weeks   Status On-going   PT LONG TERM GOAL #2   Title Active right shoulder flexion to 155 degrees in standing   Time 6   Period Weeks   Status Achieved   PT LONG TERM GOAL #3   Title Active ER to 80 degrees.   Time 6   Period Weeks   Status Achieved  10/21/15   PT LONG TERM GOAL #4   Title Patient reach behind back to T10 with right hand.   Time 6   Period Weeks   Status Achieved  10/21/15   PT LONG TERM GOAL #5   Title Right shoulder strength= 5/5.   Time 6   Period Weeks   Status On-going   PT LONG TERM GOAL #6   Title Patient able to perform ADL's with right shoulder pain not >  2/10.   Time 6   Period Weeks   Status On-going               Plan - 10/26/15 0907    Clinical Impression Statement Patient tolerated today's treatment fairly well although he reported at times some achiness in R AC joint region especially with D2 motions with 4# ball. Able to complete all exercises although he had soreness and achiness. Normal [place] response noted following end of the [place] session. Goals remain on-going at this time secondary to lack of full R shoulder flexion and strength as well as continued pain with ADLs.    Pt will benefit from skilled therapeutic intervention in order to improve on the following deficits Decreased range of motion;Pain;Decreased strength;Impaired flexibility;Postural dysfunction;Decreased activity tolerance   Rehab Potential Excellent   Clinical Impairments Affecting Rehab Potential RCR surgery 12/25/2014, ACJ surgery 08/20/2015   PT Frequency 2x / week   PT Duration 6 weeks   PT Treatment/Interventions ADLs/Self Care Home Management;Cryotherapy;Electrical Stimulation;Moist Heat;Therapeutic exercise;Manual techniques;Taping;Dry needling;Ultrasound;Neuromuscular re-education;Patient/family education;Passive range of motion;Iontophoresis /ml Dexamethasone   PT Next Visit Plan Continue per MPT POC   PT Home Exercise Plan towel stretch for IR, wall stretch for flexion   Consulted and Agree with Plan of Care Patient        Problem List Patient Active Problem List   Diagnosis Date Noted  . Anxiety and depression 08/24/2015  . Low testosterone 04/22/2015  . GERD (gastroesophageal reflux disease) 04/22/2015  . Barrett's esophagus 04/22/2015  . Essential hypertension, benign 04/22/2015    Evelene Croon, PTA 10/26/2015, 9:15 AM  [facility] [address] [place], [place], [address] Phone: [phone]   Fax:  [phone]  Name: Jeffrey Logan MRN: [id number] Date of Birth: Feb 26, 1956

## 2015-10-28 ENCOUNTER — Ambulatory Visit: Payer: Worker's Compensation | Admitting: Physical Therapy

## 2015-10-28 ENCOUNTER — Encounter: Payer: Self-pay | Admitting: Physical Therapy

## 2015-10-28 DIAGNOSIS — R531 Weakness: Secondary | ICD-10-CM

## 2015-10-28 DIAGNOSIS — M25611 Stiffness of right shoulder, not elsewhere classified: Secondary | ICD-10-CM

## 2015-10-28 DIAGNOSIS — M25511 Pain in right shoulder: Secondary | ICD-10-CM | POA: Diagnosis not present

## 2015-10-28 NOTE — Patient Instructions (Signed)
Strengthening: Resisted Diagonal Extension    Hold tubing with left arm above and behind, palm up. Bring arm down across body, rotating to palm down.  Repeat _10___ times per set. Do __2-3__ sets per session. Do _1-2___ sessions per day.  http://orth.exer.us/846   Copyright  VHI. All rights reserved.  Progressive Resisted: External Rotation (Side-Lying)    Holding __2-3__ pound weight, towel under arm, raise right forearm toward ceiling. Keep elbow bent and at side. Repeat _10___ times per set. Do __2-3__ sets per session. Do __1-2__ sessions per day.   Strengthening: Wall Push-Up    With arms slightly wider apart than shoulder width, and feet _5___ inches from wall, gently lean body toward wall. Repeat _10___ times per set. Do _2___ sets per session. Do __1-2__ sessions per day.

## 2015-10-28 NOTE — Therapy (Signed)
[facility] [place], [place], [address] Phone: [phone]   Fax:  [phone]  Physical Therapy Treatment  Patient Details  Name: Jeffrey Logan MRN: [id number] Date of Birth: 07/20/1956 Referring Provider: Melrose Nakayama, MD  Encounter Date: 10/28/2015      PT End of Session - 10/28/15 0746    Visit Number 22   Number of Visits 36   Date for PT Re-Evaluation 11/02/15   PT Start Time 0727   PT Stop Time 0813   PT Time Calculation (min) 46 min   Activity Tolerance Patient tolerated treatment well   Behavior During Therapy [facility] for tasks assessed/performed      Past Medical History  Diagnosis Date  . Hypertension   . GERD (gastroesophageal reflux disease)   . Sleep apnea 2012    wears a cpap at night    Past Surgical History  Procedure Laterality Date  . Rotator cuff repair  12/25/14    right shoulder  . Lumbar disc surgery    . Lumbar fusion      L5-S1  . Back surgery      screw placement  . Rib fracture surgery  2002    removal of left 9th rib  . Shoulder surgery Right 08/10/2015    There were no vitals filed for this visit.  Visit Diagnosis:  Pain in joint of right shoulder  Stiffness of right shoulder joint  Weakness      Subjective Assessment - 10/28/15 0733    Subjective Patient complaints of ongoing pain with use and activity with ADL's   Limitations Lifting   Patient Stated Goals Return right shoulder to previous level of function.   Currently in Pain? Yes   Pain Score 5    Pain Location Shoulder   Pain Orientation Right   Pain Descriptors / Indicators Aching;Sore   Pain Type Surgical pain   Pain Onset 1 to 4 weeks ago   Pain Frequency Constant   Aggravating Factors  ADL's or use of arm   Pain Relieving Factors at rest                         [facility] Adult PT Treatment/Exercise - 10/28/15 0001    Elbow Exercises   Elbow Flexion Strengthening;Right;Standing;Bar  weights/barbell  3x10   Bar Weights/Barbell (Elbow Flexion) 5 lbs   Shoulder Exercises: Sidelying   External Rotation --   External Rotation Weight (lbs) --   Shoulder Exercises: Standing   Protraction Strengthening;Right;Theraband  3x10   Theraband Level (Shoulder Protraction) Level 2 (Red)   External Rotation Strengthening;Right;Theraband  3x10   Theraband Level (Shoulder External Rotation) Level 2 (Red)   Internal Rotation Strengthening;Right;Theraband  3x10   Theraband Level (Shoulder Internal Rotation) Level 2 (Red)   Row Strengthening;Right;Theraband  3x10   Theraband Level (Shoulder Row) Level 2 (Red)   Other Standing Exercises 6# ball raises 3x10 reps   Other Standing Exercises diagonal resisted ext with yellow t-band 3x10   Shoulder Exercises: Pulleys   Flexion --  32mn   Other Pulley Exercises UE ranger for elevation and circles 3x10 each   Shoulder Exercises: ROM/Strengthening   UBE (Upper Arm Bike) 90 RPM x6 min   Ultrasound   Ultrasound Location right AC joint    Ultrasound Parameters 1.0w/cm2/3.364m/50% x1061m  Ultrasound Goals Pain                PT Education - 10/28/15 [id number]  Education provided Yes   Education Details HEP   Person(s) Educated Patient   Methods Explanation;Demonstration;Handout   Comprehension Verbalized understanding;Returned demonstration             PT Long Term Goals - 10/28/15 0747    PT LONG TERM GOAL #1   Title Ind with a HEP.   Time 6   Period Weeks   Status Achieved  10/28/15   PT LONG TERM GOAL #2   Title Active right shoulder flexion to 155 degrees in standing   Time 6   Period Weeks   Status Achieved   PT LONG TERM GOAL #3   Title Active ER to 80 degrees.   Time 6   Period Weeks   Status Achieved   PT LONG TERM GOAL #4   Title Patient reach behind back to T10 with right hand.   Time 6   Period Weeks   Status Achieved   PT LONG TERM GOAL #5   Title Right shoulder strength= 5/5.   Time 6    Period Weeks   Status On-going   PT LONG TERM GOAL #6   Title Patient able to perform ADL's with right shoulder pain not > 2/10.   Time 6   Period Weeks   Status On-going  5/10 10/28/15               Plan - 10/28/15 0756    Clinical Impression Statement Patient progressing with all activities yet continues to complain of pain in shoulder with a feeling of grinding. HEP given to patient today and is independent with all HEP's currrently. Patient would like to go to work conditioning before he returns to work. Patient met LTG #1 today and other goals ongoing due to pain and strength deficit.   Pt will benefit from skilled therapeutic intervention in order to improve on the following deficits Decreased range of motion;Pain;Decreased strength;Impaired flexibility;Postural dysfunction;Decreased activity tolerance   Rehab Potential Excellent   Clinical Impairments Affecting Rehab Potential RCR surgery 12/25/2014, ACJ surgery 08/20/2015   PT Frequency 2x / week   PT Duration 6 weeks   PT Treatment/Interventions ADLs/Self Care Home Management;Cryotherapy;Electrical Stimulation;Moist Heat;Therapeutic exercise;Manual techniques;Taping;Dry needling;Ultrasound;Neuromuscular re-education;Patient/family education;Passive range of motion;Iontophoresis 56m/ml Dexamethasone   PT Next Visit Plan Continue per MPT POC/ [facility]    Consulted and Agree with Plan of Care Patient        Problem List Patient Active Problem List   Diagnosis Date Noted  . Anxiety and depression 08/24/2015  . Low testosterone 04/22/2015  . GERD (gastroesophageal reflux disease) 04/22/2015  . Barrett's esophagus 04/22/2015  . Essential hypertension, benign 04/22/2015    DPhillips Climes PTA 10/28/2015, 8:20 AM  [facility] [address]Phone: [phone]  Fax:  [phone] Name: AOrestes GeimanMRN:  [id number] of Birth: 108-13-1957

## 2015-10-30 ENCOUNTER — Ambulatory Visit: Payer: Worker's Compensation | Admitting: Physical Therapy

## 2015-10-30 ENCOUNTER — Encounter: Payer: Self-pay | Admitting: Physical Therapy

## 2015-10-30 DIAGNOSIS — R531 Weakness: Secondary | ICD-10-CM

## 2015-10-30 DIAGNOSIS — M25511 Pain in right shoulder: Secondary | ICD-10-CM | POA: Diagnosis not present

## 2015-10-30 DIAGNOSIS — M25611 Stiffness of right shoulder, not elsewhere classified: Secondary | ICD-10-CM

## 2015-10-30 NOTE — Therapy (Signed)
[facility] [address] [place], [place], [address] Phone: [phone]   Fax:  [phone]  Physical Therapy Treatment  Patient Details  Name: Jeffrey Logan MRN: [id number] Date of Birth: 02-Jan-1956 Referring Provider: Marcene Corning, MD  Encounter Date: 10/30/2015      PT End of Session - 10/30/15 0826    Visit Number 23   Number of Visits 36   Date for PT Re-Evaluation 11/02/15   PT Start Time 0816   PT Stop Time 0858   PT Time Calculation (min) 42 min   Activity Tolerance Patient tolerated treatment well   Behavior During Therapy [facility] for tasks assessed/performed      Past Medical History  Diagnosis Date  . Hypertension   . GERD (gastroesophageal reflux disease)   . Sleep apnea 2012    wears a cpap at night    Past Surgical History  Procedure Laterality Date  . Rotator cuff repair  12/25/14    right shoulder  . Lumbar disc surgery    . Lumbar fusion      L5-S1  . Back surgery      screw placement  . Rib fracture surgery  2002    removal of left 9th rib  . Shoulder surgery Right 08/10/2015    There were no vitals filed for this visit.  Visit Diagnosis:  Pain in joint of right shoulder  Stiffness of right shoulder joint  Weakness      Subjective Assessment - 10/30/15 0824    Subjective Patient continues to have 5/10 R shoulder discomfort and reports he did not do anything yesterday other than watch television.   Limitations Lifting   Patient Stated Goals Return right shoulder to previous level of function.   Currently in Pain? Yes   Pain Score 5    Pain Location Shoulder   Pain Orientation Right   Pain Descriptors / Indicators Sore   Pain Type Surgical pain   Pain Onset 1 to 4 weeks ago            [facility] PT Assessment - 10/30/15 0001    Assessment   Medical Diagnosis R shoulder scope   Onset Date/Surgical Date 08/20/15   Next MD Visit 11/02/2015   ROM / Strength   AROM / PROM / Strength Strength    Strength   Overall Strength Within functional limits for tasks performed   Strength Assessment Site Shoulder   Right/Left Shoulder Right   Right Shoulder Flexion 5/5   Right Shoulder ABduction 5/5  scaption   Right Shoulder Internal Rotation 5/5   Right Shoulder External Rotation 5/5  Discomfort in R shoulder with testing                     [facility] Adult PT Treatment/Exercise - 10/30/15 0001    Shoulder Exercises: Standing   Protraction Strengthening;Right;Theraband  3x10 reps   Theraband Level (Shoulder Protraction) Level 2 (Red)   External Rotation Strengthening;Right;Theraband  3x10 reps   Theraband Level (Shoulder External Rotation) Level 2 (Red)   Internal Rotation Strengthening;Right;Theraband  3x10 reps   Theraband Level (Shoulder Internal Rotation) Level 2 (Red)   Row Strengthening;Right;Theraband  3x10 reps   Theraband Level (Shoulder Row) Level 2 (Red)   Other Standing Exercises 6# ball raises 3x10 reps   Other Standing Exercises diagonal resisted ext with yellow t-band X 15 reps   Shoulder Exercises: Pulleys   Flexion 3 minutes   Other Pulley Exercises UE ranger for elevation  and circles 3x10 each   Shoulder Exercises: ROM/Strengthening   UBE (Upper Arm Bike) 90 RPM x6 min   Modalities   Modalities Ultrasound   Ultrasound   Ultrasound Location R AC joint   Ultrasound Parameters 1.0 w/cm2, 50%, 3.3 mhz x10 min   Ultrasound Goals Pain                     PT Long Term Goals - 10/30/15 0859    PT LONG TERM GOAL #1   Title Ind with a HEP.   Time 6   Period Weeks   Status Achieved  10/28/15   PT LONG TERM GOAL #2   Title Active right shoulder flexion to 155 degrees in standing   Time 6   Period Weeks   Status Achieved   PT LONG TERM GOAL #3   Title Active ER to 80 degrees.   Time 6   Period Weeks   Status Achieved   PT LONG TERM GOAL #4   Title Patient reach behind back to T10 with right hand.   Time 6   Period Weeks    Status Achieved   PT LONG TERM GOAL #5   Title Right shoulder strength= 5/5.   Time 6   Period Weeks   Status Achieved   PT LONG TERM GOAL #6   Title Patient able to perform ADL's with right shoulder pain not > 2/10.   Time 6   Period Weeks   Status On-going  5/10 10/28/15               Plan - 10/30/15 0950    Clinical Impression Statement Patient tolerated today's treatment fairly well although he arrived to PT with 5/10 R shoulder pain. Patient was limited with standing resisted extension with yellow theraband due to grinding and popping in R shoulder region. Demonstrated no limitation with theraband exercises today as there were no reports of limitations. R shoulder MMT was measured as 5/5 throughout R shoulder today. Has achieved all goals set at evaluation except for ADLs goal secondary to pain with ADLs per patient report. Normal US response noted following end of the US session.    Pt will benefit from skilled therapeutic intervention in order to improve on the following deficits Decreased range of motion;Pain;Decreased strength;Impaired flexibility;Postural dysfunction;Decreased activity tolerance   Rehab Potential Excellent   Clinical Impairments Affecting Rehab Potential RCR surgery 12/25/2014, ACJ surgery 08/20/2015   PT Frequency 2x / week   PT Duration 6 weeks   PT Treatment/Interventions ADLs/Self Care Home Management;Cryotherapy;Electrical Stimulation;Moist Heat;Therapeutic exercise;Manual techniques;Taping;Dry needling;Ultrasound;Neuromuscular re-education;Patient/family education;Passive range of motion;Iontophoresis 4mg /ml Dexamethasone   PT Next Visit Plan Returns to Dr. Yisroel Rammingaldorf 4/3/017   PT Home Exercise Plan towel stretch for IR, wall stretch for flexion   Consulted and Agree with Plan of Care Patient        Problem List Patient Active Problem List   Diagnosis Date Noted  . Anxiety and depression 08/24/2015  . Low testosterone 04/22/2015  . GERD  (gastroesophageal reflux disease) 04/22/2015  . Barrett's esophagus 04/22/2015  . Essential hypertension, benign 04/22/2015    Florence CannerKelsey Parsons, PTA 10/30/2015 5:03 PM [place] MPT [facility] [address] [place], [place], [address] Phone: [phone]   Fax:  [phone]  Name: Jeffrey Logan MRN: [id number] Date of Birth: 01/06/1956

## 2015-11-04 ENCOUNTER — Other Ambulatory Visit: Payer: Self-pay | Admitting: Family Medicine

## 2015-11-04 NOTE — Therapy (Signed)
[facility] [place], [place], [address] Phone: [phone]   Fax:  [phone]  Physical Therapy Treatment  Patient Details  Name: Jeffrey Logan MRN: [id number] Date of Birth: 1955-10-16 Referring Provider: Melrose Nakayama, MD  Encounter Date: 10/30/2015    Past Medical History  Diagnosis Date  . Hypertension   . GERD (gastroesophageal reflux disease)   . Sleep apnea 2012    wears a cpap at night    Past Surgical History  Procedure Laterality Date  . Rotator cuff repair  12/25/14    right shoulder  . Lumbar disc surgery    . Lumbar fusion      L5-S1  . Back surgery      screw placement  . Rib fracture surgery  2002    removal of left 9th rib  . Shoulder surgery Right 08/10/2015    There were no vitals filed for this visit.  Visit Diagnosis:  Pain in joint of right shoulder  Stiffness of right shoulder joint  Weakness                                    PT Long Term Goals - 10/30/15 0859    PT LONG TERM GOAL #1   Title Ind with a HEP.   Time 6   Period Weeks   Status Achieved  10/28/15   PT LONG TERM GOAL #2   Title Active right shoulder flexion to 155 degrees in standing   Time 6   Period Weeks   Status Achieved   PT LONG TERM GOAL #3   Title Active ER to 80 degrees.   Time 6   Period Weeks   Status Achieved   PT LONG TERM GOAL #4   Title Patient reach behind back to T10 with right hand.   Time 6   Period Weeks   Status Achieved   PT LONG TERM GOAL #5   Title Right shoulder strength= 5/5.   Time 6   Period Weeks   Status Achieved   PT LONG TERM GOAL #6   Title Patient able to perform ADL's with right shoulder pain not > 2/10.   Time 6   Period Weeks   Status On-going  5/10 10/28/15               Problem List Patient Active Problem List   Diagnosis Date Noted  . Anxiety and depression 08/24/2015  . Low testosterone 04/22/2015  . GERD  (gastroesophageal reflux disease) 04/22/2015  . Barrett's esophagus 04/22/2015  . Essential hypertension, benign 04/22/2015  PHYSICAL THERAPY DISCHARGE SUMMARY  Visits from Start of Care:   Current functional level related to goals / functional outcomes: Please see above.   Remaining deficits: Goal #6 unmet.   Education / Equipment: HEP.  Plan: Patient agrees to discharge.  Patient goals were partially met. Patient is being discharged due to meeting the stated rehab goals.  ?????       Thinh Cuccaro, [place] MPT 11/04/2015, 5:48 PM  [facility] [address] [place], [place], [address] Phone: [phone]   Fax:  [phone]  Name: Jeffrey Logan MRN: [id number] Date of Birth: April 20, 1956

## 2015-12-23 ENCOUNTER — Ambulatory Visit (INDEPENDENT_AMBULATORY_CARE_PROVIDER_SITE_OTHER): Payer: Managed Care, Other (non HMO) | Admitting: Family Medicine

## 2015-12-23 ENCOUNTER — Ambulatory Visit (INDEPENDENT_AMBULATORY_CARE_PROVIDER_SITE_OTHER): Payer: Managed Care, Other (non HMO)

## 2015-12-23 ENCOUNTER — Encounter: Payer: Self-pay | Admitting: Family Medicine

## 2015-12-23 VITALS — BP 133/88 | HR 56 | Temp 97.9°F | Ht 72.0 in | Wt 209.6 lb

## 2015-12-23 DIAGNOSIS — M79645 Pain in left finger(s): Secondary | ICD-10-CM

## 2015-12-23 MED ORDER — PREDNISONE 20 MG PO TABS: ORAL_TABLET | ORAL | Status: DC

## 2015-12-23 NOTE — Progress Notes (Signed)
BP 133/88 mmHg  Pulse 56  Temp(Src) 97.9 F (36.6 C) (Oral)  Ht 6' (1.829 m)  Wt 209 lb 9.6 oz (95.074 kg)  BMI 28.42 kg/m2   Subjective:    Patient ID: Jeffrey Logan, male    DOB: 06/02/1956, 60 y.o.   MRN: [id number]  HPI: Jeffrey Logan is a 60 y.o. male presenting on 12/23/2015 for Hand Pain   HPI  Finger pain left ring Patient has pain in his left ring finger near the MCP joint that has been persisting for the past month. He is working with somewhat and as he is trying to grip and pull it towards him he felt something pop in that spot about 1 month ago and ever since then he has been having pain and soreness. He especially wakes up with pain and soreness and swelling in the morning and as he progresses through the day he is able to get more range of motion and the pain does decrease some. He feels like his grip strength is weakened on that side because of that finger. He denies any loss of sensation but does have some slight decreased grip strength because of the loss of range of motion because of the swelling in that finger. He denies any redness or warmth or fevers or chills.  Relevant past medical, surgical, family and social history reviewed and updated as indicated. Interim medical history since our last visit reviewed. Allergies and medications reviewed and updated.  Review of Systems  Constitutional: Negative for fever.  HENT: Negative for ear discharge and ear pain.   Eyes: Negative for discharge and visual disturbance.  Respiratory: Negative for shortness of breath and wheezing.   Cardiovascular: Negative for chest pain and leg swelling.  Gastrointestinal: Negative for abdominal pain, diarrhea and constipation.  Genitourinary: Negative for difficulty urinating.  Musculoskeletal: Positive for joint swelling and arthralgias. Negative for back pain and gait problem.  Skin: Negative for rash.  Neurological: Negative for syncope, light-headedness and headaches.  All other systems  reviewed and are negative.   Per HPI unless specifically indicated above     Medication List       This list is accurate as of: 12/23/15 10:31 AM.  Always use your most recent med list.               amLODipine 10 MG tablet  Commonly known as:  NORVASC  TAKE 1 TABLET BY MOUTH DAILY     aspirin 81 MG tablet  Take 81 mg by mouth daily.     citalopram 20 MG tablet  Commonly known as:  CELEXA  Take 1 tablet (20 mg total) by mouth daily.     diclofenac 75 MG EC tablet  Commonly known as:  VOLTAREN  Take 1 tablet (75 mg total) by mouth 2 (two) times daily.     esomeprazole 40 MG capsule  Commonly known as:  NEXIUM  Take 1 capsule (40 mg total) by mouth 2 (two) times daily before a meal.     fluticasone 50 MCG/ACT nasal spray  Commonly known as:  FLONASE  Place 1 spray into both nostrils 2 (two) times daily as needed for allergies or rhinitis.     gabapentin 100 MG capsule  Commonly known as:  NEURONTIN  Take 100 mg by mouth daily. Two tablets once a day     meclizine 25 MG tablet  Commonly known as:  ANTIVERT  Take 1 tablet (25 mg total) by mouth 3 (three) times daily  as needed for dizziness.     testosterone cypionate 100 MG/ML injection  Commonly known as:  DEPOTESTOTERONE CYPIONATE  Inject into the muscle every 14 (fourteen) days. For IM use only     traMADol 50 MG tablet  Commonly known as:  ULTRAM  Take by mouth daily.           Objective:    BP 133/88 mmHg  Pulse 56  Temp(Src) 97.9 F (36.6 C) (Oral)  Ht 6' (1.829 m)  Wt 209 lb 9.6 oz (95.074 kg)  BMI 28.42 kg/m2  Wt Readings from Last 3 Encounters:  12/23/15 209 lb 9.6 oz (95.074 kg)  09/21/15 202 lb 9.6 oz (91.899 kg)  08/24/15 202 lb 6.4 oz (91.808 kg)    Physical Exam  Constitutional: He is oriented to person, place, and time. He appears well-developed and well-nourished. No distress.  Eyes: Conjunctivae and EOM are normal. Pupils are equal, round, and reactive to light. Right eye exhibits  no discharge. No scleral icterus.  Cardiovascular: Normal rate, regular rhythm, normal heart sounds and intact distal pulses.   No murmur heard. Pulmonary/Chest: Effort normal and breath sounds normal. No respiratory distress. He has no wheezes.  Musculoskeletal: Normal range of motion. He exhibits no edema.       Left hand: He exhibits bony tenderness and swelling. He exhibits normal capillary refill, no deformity and no laceration. Normal sensation noted. Normal strength (On exam no decreased strength noted) noted.       Hands: Neurological: He is alert and oriented to person, place, and time. Coordination normal.  Skin: Skin is warm and dry. No rash noted. He is not diaphoretic.  Psychiatric: He has a normal mood and affect. His behavior is normal.  Vitals reviewed.  Left ring finger x-ray: No acute bony abnormalities noted.    Assessment & Plan:   Problem List Items Addressed This Visit    None    Visit Diagnoses    Finger pain, left    -  Primary    Pain in left ring finger near the MCP joint, will do x-ray, if normal will do anti-inflammatories because likely a tendinitis    Relevant Medications    predniSONE (DELTASONE) 20 MG tablet    Other Relevant Orders    DG Finger Ring Left        Follow up plan: Return if symptoms worsen or fail to improve.  Counseling provided for all of the vaccine components Orders Placed This Encounter  Procedures  . DG Finger Ring Left    Arville Care, MD [facility] 12/23/2015, 10:31 AM

## 2016-05-13 ENCOUNTER — Encounter: Payer: Self-pay | Admitting: Family Medicine

## 2016-05-13 ENCOUNTER — Ambulatory Visit (INDEPENDENT_AMBULATORY_CARE_PROVIDER_SITE_OTHER): Payer: Managed Care, Other (non HMO) | Admitting: Family Medicine

## 2016-05-13 VITALS — BP 129/81 | HR 62 | Temp 97.0°F | Ht 73.83 in | Wt 203.6 lb

## 2016-05-13 DIAGNOSIS — J0100 Acute maxillary sinusitis, unspecified: Secondary | ICD-10-CM

## 2016-05-13 MED ORDER — AMOXICILLIN-POT CLAVULANATE 875-125 MG PO TABS: 1.0000 | ORAL_TABLET | Freq: Two times a day (BID) | ORAL | 0 refills | Status: DC

## 2016-05-13 MED ORDER — PREDNISONE 20 MG PO TABS: 40.0000 mg | ORAL_TABLET | Freq: Every day | ORAL | 0 refills | Status: DC

## 2016-05-13 MED ORDER — TRAMADOL HCL 50 MG PO TABS: 50.0000 mg | ORAL_TABLET | Freq: Two times a day (BID) | ORAL | 0 refills | Status: DC

## 2016-05-13 NOTE — Patient Instructions (Signed)
Great to meet you!  Sinusitis, Adult Sinusitis is redness, soreness, and puffiness (inflammation) of the air pockets in the bones of your face (sinuses). The redness, soreness, and puffiness can cause air and mucus to get trapped in your sinuses. This can allow germs to grow and cause an infection.  HOME CARE   Drink enough fluids to keep your pee (urine) clear or pale yellow.  Use a humidifier in your home.  Run a hot shower to create steam in the bathroom. Sit in the bathroom with the door closed. Breathe in the steam 3-4 times a day.  Put a warm, moist washcloth on your face 3-4 times a day, or as told by your doctor.  Use salt water sprays (saline sprays) to wet the thick fluid in your nose. This can help the sinuses drain.  Only take medicine as told by your doctor. GET HELP RIGHT AWAY IF:   Your pain gets worse.  You have very bad headaches.  You are sick to your stomach (nauseous).  You throw up (vomit).  You are very sleepy (drowsy) all the time.  Your face is puffy (swollen).  Your vision changes.  You have a stiff neck.  You have trouble breathing. MAKE SURE YOU:   Understand these instructions.  Will watch your condition.  Will get help right away if you are not doing well or get worse.   This information is not intended to replace advice given to you by your health care provider. Make sure you discuss any questions you have with your health care provider.   Document Released: 01/04/2008 Document Revised: 08/08/2014 Document Reviewed: 02/21/2012 Elsevier Interactive Patient Education 2016 Elsevier Inc.  

## 2016-05-13 NOTE — Progress Notes (Signed)
   HPI  Patient presents today here with cough and cold symptoms.  Patient complains of 2 weeks of persistent productive cough of clear sputum, nasal congestion, and facial pain.  Patient states that he is having mild dizziness wiHe denies any muffled hearing,shortness of breath, or difficulty tolerating foods or fluids.  He does have coughing fits which make it difficult to breathe during fits.  He also has chronic back pain and is having difflculty getting his tramadol currently due [facility], he would like a refill if possible.  PMH: Smoking status noted ROS: Per HPI  Objective: BP 129/81   Pulse 62   Temp 97 F (36.1 C) (Oral)   Ht 6' 1.83" (1.875 m)   Wt 203 lb 9.6 oz (92.4 kg)   BMI 26.26 kg/m  Gen: NAD, alert, cooperative with exam HEENT: NCAT, nares clear, oropharynx erythematous, left-sided maxillary sinus tenderness to palpation CV: RRR, good S1/S2, no murmur Resp: CTABL, no wheezes, non-labored Ext: No edema, warm Neuro: Alert and oriented, No gross deficits  Assessment and plan:  # acute maxillary sinusitis Treating with Augmentin and prednisone Severe cough as well. Discussed supportive care Return to clinic if worsening or does not improve as expected.  Tramadol given, [facility] usually gets [facility]   Meds ordered this encounter  Medications  . traMADol (ULTRAM) 50 MG tablet    Sig: Take 1 tablet (50 mg total) by mouth 2 (two) times daily.    Dispense:  60 tablet    Refill:  0  . amoxicillin-clavulanate (AUGMENTIN) 875-125 MG tablet    Sig: Take 1 tablet by mouth 2 (two) times daily.    Dispense:  20 tablet    Refill:  0  . predniSONE (DELTASONE) 20 MG tablet    Sig: Take 2 tablets (40 mg total) by mouth daily with breakfast.    Dispense:  10 tablet    Refill:  0    Murtis SinkSam Bradshaw, MD [facility] 05/13/2016, 9:00 AM

## 2016-05-29 ENCOUNTER — Emergency Department (HOSPITAL_COMMUNITY): Payer: Managed Care, Other (non HMO)

## 2016-05-29 ENCOUNTER — Emergency Department (HOSPITAL_COMMUNITY)
Admission: EM | Admit: 2016-05-29 | Discharge: 2016-05-29 | Disposition: A | Discharge status: Home / Self Care | Payer: Managed Care, Other (non HMO) | Attending: Emergency Medicine | Admitting: Emergency Medicine

## 2016-05-29 ENCOUNTER — Encounter (HOSPITAL_COMMUNITY): Payer: Self-pay | Admitting: *Deleted

## 2016-05-29 DIAGNOSIS — F17210 Nicotine dependence, cigarettes, uncomplicated: Secondary | ICD-10-CM | POA: Diagnosis not present

## 2016-05-29 DIAGNOSIS — Z79899 Other long term (current) drug therapy: Secondary | ICD-10-CM | POA: Diagnosis not present

## 2016-05-29 DIAGNOSIS — K5732 Diverticulitis of large intestine without perforation or abscess without bleeding: Secondary | ICD-10-CM | POA: Diagnosis not present

## 2016-05-29 DIAGNOSIS — Z792 Long term (current) use of antibiotics: Secondary | ICD-10-CM | POA: Diagnosis not present

## 2016-05-29 DIAGNOSIS — I10 Essential (primary) hypertension: Secondary | ICD-10-CM | POA: Insufficient documentation

## 2016-05-29 DIAGNOSIS — R1032 Left lower quadrant pain: Secondary | ICD-10-CM | POA: Diagnosis present

## 2016-05-29 LAB — CBC WITH DIFFERENTIAL/PLATELET
Basophils Absolute: 0 10*3/uL (ref 0.0–0.1)
Basophils Relative: 0 %
Eosinophils Absolute: 0.1 10*3/uL (ref 0.0–0.7)
Eosinophils Relative: 1 %
HCT: 40.9 % (ref 39.0–52.0)
Hemoglobin: 14.2 g/dL (ref 13.0–17.0)
Lymphocytes Relative: 18 %
Lymphs Abs: 2.3 10*3/uL (ref 0.7–4.0)
MCH: 32.6 pg (ref 26.0–34.0)
MCHC: 34.7 g/dL (ref 30.0–36.0)
MCV: 94 fL (ref 78.0–100.0)
Monocytes Absolute: 1.1 10*3/uL — ABNORMAL HIGH (ref 0.1–1.0)
Monocytes Relative: 9 %
Neutro Abs: 9.3 10*3/uL — ABNORMAL HIGH (ref 1.7–7.7)
Neutrophils Relative %: 72 %
Platelets: 194 10*3/uL (ref 150–400)
RBC: 4.35 MIL/uL (ref 4.22–5.81)
RDW: 13.3 % (ref 11.5–15.5)
WBC: 12.9 10*3/uL — ABNORMAL HIGH (ref 4.0–10.5)

## 2016-05-29 LAB — URINALYSIS, ROUTINE W REFLEX MICROSCOPIC
Bilirubin Urine: NEGATIVE
Glucose, UA: NEGATIVE mg/dL
Hgb urine dipstick: NEGATIVE
Ketones, ur: NEGATIVE mg/dL
Leukocytes, UA: NEGATIVE
Nitrite: NEGATIVE
Protein, ur: NEGATIVE mg/dL
Specific Gravity, Urine: 1.015 (ref 1.005–1.030)
pH: 7 (ref 5.0–8.0)

## 2016-05-29 LAB — COMPREHENSIVE METABOLIC PANEL
ALT: 23 U/L (ref 17–63)
AST: 24 U/L (ref 15–41)
Albumin: 3.3 g/dL — ABNORMAL LOW (ref 3.5–5.0)
Alkaline Phosphatase: 62 U/L (ref 38–126)
Anion gap: 7 (ref 5–15)
BUN: 18 mg/dL (ref 6–20)
CO2: 25 mmol/L (ref 22–32)
Calcium: 8.7 mg/dL — ABNORMAL LOW (ref 8.9–10.3)
Chloride: 102 mmol/L (ref 101–111)
Creatinine, Ser: 1.28 mg/dL — ABNORMAL HIGH (ref 0.61–1.24)
GFR calc Af Amer: >60 mL/min (ref >60)
GFR calc non Af Amer: >60 mL/min (ref >60)
Glucose, Bld: 119 mg/dL — ABNORMAL HIGH (ref 65–99)
Potassium: 3.8 mmol/L (ref 3.5–5.1)
Sodium: 134 mmol/L — ABNORMAL LOW (ref 135–145)
Total Bilirubin: 0.5 mg/dL (ref 0.3–1.2)
Total Protein: 7.3 g/dL (ref 6.5–8.1)

## 2016-05-29 LAB — LIPASE, BLOOD: Lipase: 21 U/L (ref 11–51)

## 2016-05-29 MED ORDER — METRONIDAZOLE IN NACL 5-0.79 MG/ML-% IV SOLN
500.0000 mg | Freq: Once | INTRAVENOUS | Status: AC
  Administered 2016-05-29: 500 mg via INTRAVENOUS
  Filled 2016-05-29: qty 100

## 2016-05-29 MED ORDER — HYDROMORPHONE HCL 1 MG/ML IJ SOLN
1.0000 mg | Freq: Once | INTRAMUSCULAR | Status: AC
Start: 2016-05-29 — End: 2016-05-29
  Administered 2016-05-29: 1 mg via INTRAVENOUS
  Filled 2016-05-29: qty 1

## 2016-05-29 MED ORDER — IOPAMIDOL (ISOVUE-300) INJECTION 61%
100.0000 mL | Freq: Once | INTRAVENOUS | Status: AC | PRN
  Administered 2016-05-29: 100 mL via INTRAVENOUS

## 2016-05-29 MED ORDER — IOPAMIDOL (ISOVUE-300) INJECTION 61%
INTRAVENOUS | Status: AC
  Administered 2016-05-29: 15 mL via ORAL
  Filled 2016-05-29: qty 30

## 2016-05-29 MED ORDER — ONDANSETRON HCL 4 MG/2ML IJ SOLN
4.0000 mg | Freq: Once | INTRAMUSCULAR | Status: AC
  Administered 2016-05-29: 4 mg via INTRAVENOUS
  Filled 2016-05-29: qty 2

## 2016-05-29 MED ORDER — CIPROFLOXACIN HCL 500 MG PO TABS: 500.0000 mg | ORAL_TABLET | Freq: Two times a day (BID) | ORAL | 0 refills | Status: DC

## 2016-05-29 MED ORDER — SODIUM CHLORIDE 0.9 % IV BOLUS (SEPSIS)
1000.0000 mL | Freq: Once | INTRAVENOUS | Status: AC
  Administered 2016-05-29: 1000 mL via INTRAVENOUS

## 2016-05-29 MED ORDER — OXYCODONE-ACETAMINOPHEN 5-325 MG PO TABS: 2.0000 | ORAL_TABLET | ORAL | 0 refills | Status: DC | PRN

## 2016-05-29 MED ORDER — METRONIDAZOLE 500 MG PO TABS: 500.0000 mg | ORAL_TABLET | Freq: Three times a day (TID) | ORAL | 0 refills | Status: DC

## 2016-05-29 MED ORDER — KETOROLAC TROMETHAMINE 30 MG/ML IJ SOLN
15.0000 mg | Freq: Once | INTRAMUSCULAR | Status: AC
  Administered 2016-05-29: 15 mg via INTRAVENOUS
  Filled 2016-05-29: qty 1

## 2016-05-29 MED ORDER — CIPROFLOXACIN IN D5W 400 MG/200ML IV SOLN
400.0000 mg | Freq: Once | INTRAVENOUS | Status: AC
  Administered 2016-05-29: 400 mg via INTRAVENOUS
  Filled 2016-05-29: qty 200

## 2016-05-29 NOTE — ED Provider Notes (Signed)
[facility]-EMERGENCY DEPT Provider Note   CSN: [id number] Arrival date & time: 05/29/16  1857 By signing my name below, I, Jeffrey Logan, attest that this documentation has been prepared under the direction and in the presence of Raeford RazorStephen Teasia Zapf, MD . Electronically Signed: [name], Scribe. 05/29/2016. 8:38 PM.   History   Chief Complaint Chief Complaint  Patient presents with  . Flank Pain   HPI Jeffrey Logan is a 60 y.o. male with hx of GERD, HTN, and diverticulitis who presents to the Emergency Department complaining of waxing and waning LLQ pain onset 3 days ago. Pain is exacerbated by inspiration. He describes the pain as sharp and rates it as severe. He notes associated fever (tmax 101), headache, generalized weakness, diaphoresis, cough, chills, dysuria which began today, and increased urinary frequency. He has taken aspirin with some relief. He states this does not feel like prior diverticulitis. Abdominal SHx includes left rib removal and hernia repair. Pt denies any hematuria, constipation or SOB. Pt has no other concerns at this time.   The history is provided by the patient. No language interpreter was used.   Past Medical History:  Diagnosis Date  . GERD (gastroesophageal reflux disease)   . Hypertension   . Sleep apnea 2012   wears a cpap at night   Patient Active Problem List   Diagnosis Date Noted  . Anxiety and depression 08/24/2015  . Low testosterone 04/22/2015  . GERD (gastroesophageal reflux disease) 04/22/2015  . Barrett's esophagus 04/22/2015  . Essential hypertension, benign 04/22/2015    Past Surgical History:  Procedure Laterality Date  . BACK SURGERY     screw placement  . LUMBAR DISC SURGERY    . LUMBAR FUSION     L5-S1  . RIB FRACTURE SURGERY  2002   removal of left 9th rib  . ROTATOR CUFF REPAIR  12/25/14   right shoulder  . SHOULDER SURGERY Right 08/10/2015    Home Medications    Prior to Admission medications   Medication Sig Start Date  End Date Taking? Authorizing Provider  amLODipine (NORVASC) 10 MG tablet TAKE 1 TABLET BY MOUTH DAILY 11/05/15   Elige RadonJoshua A Dettinger, MD  amoxicillin-clavulanate (AUGMENTIN) 875-125 MG tablet Take 1 tablet by mouth 2 (two) times daily. 05/13/16   Elenora GammaSamuel L Bradshaw, MD  aspirin 81 MG tablet Take 81 mg by mouth daily.    Historical Provider, MD  citalopram (CELEXA) 20 MG tablet Take 1 tablet (20 mg total) by mouth daily. 08/24/15   Elige RadonJoshua A Dettinger, MD  diclofenac (VOLTAREN) 75 MG EC tablet Take 1 tablet (75 mg total) by mouth 2 (two) times daily. 08/19/15   Elige RadonJoshua A Dettinger, MD  esomeprazole (NEXIUM) 40 MG capsule Take 1 capsule (40 mg total) by mouth 2 (two) times daily before a meal. 08/25/15   Elige RadonJoshua A Dettinger, MD  fluticasone (FLONASE) 50 MCG/ACT nasal spray Place 1 spray into both nostrils 2 (two) times daily as needed for allergies or rhinitis. 08/24/15   Elige RadonJoshua A Dettinger, MD  gabapentin (NEURONTIN) 100 MG capsule Take 100 mg by mouth daily. Two tablets once a day    Historical Provider, MD  meclizine (ANTIVERT) 25 MG tablet Take 1 tablet (25 mg total) by mouth 3 (three) times daily as needed for dizziness. 09/21/15   Elige RadonJoshua A Dettinger, MD  predniSONE (DELTASONE) 20 MG tablet Take 2 tablets (40 mg total) by mouth daily with breakfast. 05/13/16   Elenora GammaSamuel L Bradshaw, MD  testosterone cypionate (DEPOTESTOTERONE CYPIONATE) 100 MG/ML  injection Inject into the muscle every 14 (fourteen) days. For IM use only    Historical Provider, MD  traMADol (ULTRAM) 50 MG tablet Take 1 tablet (50 mg total) by mouth 2 (two) times daily. 05/13/16   Elenora Gamma, MD    Family History Family History  Problem Relation Age of Onset  . Diabetes Mother   . Heart disease Father   . Heart disease Brother     Social History Social History  Substance Use Topics  . Smoking status: Current Some Day Smoker    Packs/day: 1.00    Years: 15.00    Types: Cigarettes    Last attempt to quit: 04/15/2015  . Smokeless  tobacco: Never Used  . Alcohol use 3.0 oz/week    1 Cans of beer, 4 Standard drinks or equivalent per week    Allergies   Valium [diazepam]  Review of Systems Review of Systems 10 systems reviewed and all are negative for acute change except as noted in the HPI.   Physical Exam Updated Vital Signs BP 133/79   Pulse 74   Temp 98.4 F (36.9 C) (Oral)   Resp 18   Ht 6' (1.829 m)   Wt 206 lb (93.4 kg)   SpO2 97%   BMI 27.94 kg/m   Physical Exam  Constitutional: He is oriented to person, place, and time. He appears well-developed and well-nourished.  HENT:  Head: Normocephalic and atraumatic.  Eyes: EOM are normal.  Neck: Normal range of motion.  Cardiovascular: Normal rate, regular rhythm, normal heart sounds and intact distal pulses.   Pulmonary/Chest: Effort normal and breath sounds normal. No respiratory distress.  Abdominal: Soft. He exhibits no distension. There is tenderness.  Tender left flank  Musculoskeletal: Normal range of motion.  Neurological: He is alert and oriented to person, place, and time.  Skin: Skin is warm and dry.  Psychiatric: He has a normal mood and affect. Judgment normal.  Nursing note and vitals reviewed.  ED Treatments / Results  DIAGNOSTIC STUDIES:  Oxygen Saturation is 97% on RA, normal by my interpretation.    COORDINATION OF CARE:  8:39 PM Will order CT abdomen. Discussed treatment plan which includes Toradol, dilaudid, and Zofran with pt at bedside and pt agreed to plan.  Labs (all labs ordered are listed, but only abnormal results are displayed) Labs Reviewed  CBC WITH DIFFERENTIAL/PLATELET - Abnormal; Notable for the following:       Result Value   WBC 12.9 (*)    Neutro Abs 9.3 (*)    Monocytes Absolute 1.1 (*)    All other components within normal limits  COMPREHENSIVE METABOLIC PANEL - Abnormal; Notable for the following:    Sodium 134 (*)    Glucose, Bld 119 (*)    Creatinine, Ser 1.28 (*)    Calcium 8.7 (*)     Albumin 3.3 (*)    All other components within normal limits  URINALYSIS, ROUTINE W REFLEX MICROSCOPIC (NOT AT [facility])  LIPASE, BLOOD    EKG  EKG Interpretation  Date/Time:  Sunday May 29 2016 19:14:09 EDT Ventricular Rate:  79 PR Interval:    QRS Duration: 104 QT Interval:  339 QTC Calculation: 389 R Axis:   -23 Text Interpretation:  Sinus rhythm Borderline left axis deviation RSR' in V1 or V2, right VCD or RVH Baseline wander in lead(s) V6 No old tracing to compare Confirmed by [facility]  MD, MARTHA [phone]) on 05/30/2016 7:28:23 PM       Radiology  Dg Chest 2 View  Result Date: 05/29/2016 CLINICAL DATA:  Shortness of breath. EXAM: CHEST  2 VIEW COMPARISON:  CT scan of November 18, 2010. FINDINGS: The heart size and mediastinal contours are within normal limits. Right lung is clear. Minimal left basilar subsegmental atelectasis or scarring is noted. No pneumothorax or pleural effusion is noted. The visualized skeletal structures are unremarkable. IMPRESSION: Minimal left basilar subsegmental atelectasis or scarring. Electronically Signed   By: Lupita RaiderJames  Green Jr, M.D.   On: 05/29/2016 20:00    Procedures Procedures (including critical care time)  Medications Ordered in ED Medications - No data to display   Initial Impression / Assessment and Plan / ED Course  I have reviewed the triage vital signs and the nursing notes.  Pertinent labs & imaging results that were available during my care of the patient were reviewed by me and considered in my medical decision making (see chart for details).  Clinical Course    Diverticulitis without perforation or abscess. I feel is appropriate for outpatient treatment.  It has been determined that no acute conditions requiring further emergency intervention are present at this time. The patient has been advised of the diagnosis and plan. I reviewed any labs and imaging including any potential incidental findings. We have discussed signs and  symptoms that warrant return to the ED and they are listed in the discharge instructions.    Final Clinical Impressions(s) / ED Diagnoses   Final diagnoses:  Diverticulitis of large intestine without perforation or abscess without bleeding    New Prescriptions New Prescriptions   No medications on file    I personally preformed the services scribed in my presence. The recorded information has been reviewed is accurate. Raeford RazorStephen Duvid Smalls, MD.    Raeford RazorStephen Klye Besecker, MD 06/06/16 1155

## 2016-05-29 NOTE — ED Triage Notes (Addendum)
Pt reports left sided flank that radiates around to the front of his abdomen. Pt reports sob when trying to take a deep breath. Pt reports nausea, headache, night sweats and fever since Friday. Pt reports SOB is getting worse.

## 2016-08-12 ENCOUNTER — Ambulatory Visit (INDEPENDENT_AMBULATORY_CARE_PROVIDER_SITE_OTHER): Payer: Managed Care, Other (non HMO) | Admitting: Nurse Practitioner

## 2016-08-12 ENCOUNTER — Encounter: Payer: Self-pay | Admitting: Nurse Practitioner

## 2016-08-12 VITALS — BP 122/80 | HR 71 | Temp 98.0°F | Ht 72.0 in | Wt 201.0 lb

## 2016-08-12 DIAGNOSIS — J019 Acute sinusitis, unspecified: Secondary | ICD-10-CM | POA: Diagnosis not present

## 2016-08-12 DIAGNOSIS — J209 Acute bronchitis, unspecified: Secondary | ICD-10-CM

## 2016-08-12 MED ORDER — BENZONATATE 100 MG PO CAPS: 100.0000 mg | ORAL_CAPSULE | Freq: Three times a day (TID) | ORAL | 0 refills | Status: DC | PRN

## 2016-08-12 MED ORDER — AMOXICILLIN-POT CLAVULANATE 875-125 MG PO TABS: 1.0000 | ORAL_TABLET | Freq: Two times a day (BID) | ORAL | 0 refills | Status: DC

## 2016-08-12 NOTE — Patient Instructions (Signed)

## 2016-08-12 NOTE — Progress Notes (Signed)
Subjective:     Jeffrey Logan is a 61 y.o. male who presents for evaluation of sinus pain. Symptoms include: congestion, cough, facial pain, headaches, nasal congestion, purulent rhinorrhea and sore throat. Onset of symptoms was 3 days ago. Symptoms have been gradually worsening since that time. Past history is significant for occasional episodes of bronchitis. Patient is a smoker  (1/2 ppd x intermittent for over 20 yrs).  The following portions of the patient's history were reviewed and updated as appropriate: allergies, current medications, past family history, past medical history, past social history, past surgical history and problem list.  Review of Systems Pertinent items noted in HPI and remainder of comprehensive ROS otherwise negative.   Objective:    BP 122/80   Pulse 71   Temp 98 F (36.7 C) (Oral)   Ht 6' (1.829 m)   Wt 201 lb (91.2 kg)   BMI 27.26 kg/m  General appearance: alert and cooperative Eyes: conjunctivae/corneas clear. PERRL, EOM's intact. Fundi benign. Ears: normal TM's and external ear canals both ears Nose: clear discharge, moderate congestion, turbinates red, sinus tenderness bilateral Throat: lips, mucosa, and tongue normal; teeth and gums normal and posterior oral pharynx erythematous Neck: no adenopathy, no carotid bruit, no JVD, supple, symmetrical, trachea midline and thyroid not enlarged, symmetric, no tenderness/mass/nodules Lungs: clear to auscultation bilaterally Heart: regular rate and rhythm, S1, S2 normal, no murmur, click, rub or gallop    Assessment:    Acute bacterial sinusitis and acute bronchitis .    Plan:   1. Acute non-recurrent sinusitis, unspecified location   2. Acute bronchitis, unspecified organism    Meds ordered this encounter  Medications  . amoxicillin-clavulanate (AUGMENTIN) 875-125 MG tablet    Sig: Take 1 tablet by mouth 2 (two) times daily.    Dispense:  20 tablet    Refill:  0    Order Specific Question:    Supervising Provider    Answer:   VINCENT, CAROL L [[id number]]  . benzonatate (TESSALON PERLES) 100 MG capsule    Sig: Take 1 capsule (100 mg total) by mouth 3 (three) times daily as needed for cough.    Dispense:  20 capsule    Refill:  0    Order Specific Question:   Supervising Provider    Answer:   VINCENT, CAROL L [[id number]]   1. Take meds as prescribed 2. Use a cool mist humidifier especially during the winter months and when heat has been humid. 3. Use saline nose sprays frequently 4. Saline irrigations of the nose can be very helpful if done frequently.  * 4X daily for 1 week*  * Use of a nettie pot can be helpful with this. Follow directions with this* 5. Drink plenty of fluids 6. Keep thermostat turn down low 7.For any cough or congestion  Use plain Mucinex- regular strength or max strength is fine   * Children- consult with Pharmacist for dosing 8. For fever or aces or pains- take tylenol or ibuprofen appropriate for age and weight.  * for fevers greater than 101 orally you may alternate ibuprofen and tylenol every  3 hours.   Mary-Margaret Daphine DeutscherMartin, FNP

## 2016-08-15 ENCOUNTER — Ambulatory Visit (INDEPENDENT_AMBULATORY_CARE_PROVIDER_SITE_OTHER): Payer: Managed Care, Other (non HMO) | Admitting: Family Medicine

## 2016-08-15 ENCOUNTER — Encounter: Payer: Self-pay | Admitting: Family Medicine

## 2016-08-15 VITALS — BP 107/60 | HR 60 | Temp 97.1°F | Ht 72.0 in | Wt 202.8 lb

## 2016-08-15 DIAGNOSIS — J209 Acute bronchitis, unspecified: Secondary | ICD-10-CM

## 2016-08-15 MED ORDER — PREDNISONE 20 MG PO TABS: ORAL_TABLET | ORAL | 0 refills | Status: DC

## 2016-08-15 MED ORDER — ALBUTEROL SULFATE HFA 108 (90 BASE) MCG/ACT IN AERS: 2.0000 | INHALATION_SPRAY | Freq: Four times a day (QID) | RESPIRATORY_TRACT | 0 refills | Status: DC | PRN

## 2016-08-15 NOTE — Progress Notes (Signed)
BP 107/60   Pulse 60   Temp 97.1 F (36.2 C) (Oral)   Ht 6' (1.829 m)   Wt 202 lb 12.8 oz (92 kg)   BMI 27.50 kg/m    Subjective:    Patient ID: Jeffrey Logan, male    DOB: 10/03/1955, 61 y.o.   MRN: [id number]  HPI: Jeffrey Logan is a 61 y.o. male presenting on 08/15/2016 for Cough (worse since Friday, feels like been hit by baseball bat, Started last Wednesday) and Fatigue   HPI Cough and congestion Patient has been having cough and congestion and wheezing and chest congestion that is worse at night and day that is been going on for the past 5 days. He did come in and see one of my colleagues 3 days ago and was diagnosed with bronchitis and given Augmentin and benzonatate. He feels like he has not gotten much better over the past 2-1/2 days taking those medications. He he has been having aches and chills but no fever. He denies any sick contacts that he knows of although his wife didn't get ill the day after he got this. He denies any shortness of breath but does think he is having wheezing. His cough is productive of yellow-green sputum.  Relevant past medical, surgical, family and social history reviewed and updated as indicated. Interim medical history since our last visit reviewed. Allergies and medications reviewed and updated.  Review of Systems  Constitutional: Positive for fever. Negative for chills.  HENT: Positive for congestion, postnasal drip, rhinorrhea, sinus pressure, sneezing and sore throat. Negative for ear discharge, ear pain and voice change.   Eyes: Negative for pain, discharge, redness and visual disturbance.  Respiratory: Positive for cough and wheezing. Negative for shortness of breath.   Cardiovascular: Negative for chest pain and leg swelling.  Musculoskeletal: Negative for gait problem.  Skin: Negative for rash.  All other systems reviewed and are negative.   Per HPI unless specifically indicated above   Allergies as of 08/15/2016      Reactions   Valium  [diazepam] Other (See Comments)   hypotension      Medication List       Accurate as of 08/15/16 11:23 AM. Always use your most recent med list.          albuterol 108 (90 Base) MCG/ACT inhaler Commonly known as:  PROVENTIL HFA;VENTOLIN HFA Inhale 2 puffs into the lungs every 6 (six) hours as needed for wheezing or shortness of breath.   amLODipine 10 MG tablet Commonly known as:  NORVASC TAKE 1 TABLET BY MOUTH DAILY   amoxicillin-clavulanate 875-125 MG tablet Commonly known as:  AUGMENTIN Take 1 tablet by mouth 2 (two) times daily.   benzonatate 100 MG capsule Commonly known as:  TESSALON PERLES Take 1 capsule (100 mg total) by mouth 3 (three) times daily as needed for cough.   citalopram 20 MG tablet Commonly known as:  CELEXA Take 1 tablet (20 mg total) by mouth daily.   diclofenac 75 MG EC tablet Commonly known as:  VOLTAREN Take 1 tablet (75 mg total) by mouth 2 (two) times daily.   esomeprazole 20 MG capsule Commonly known as:  NEXIUM Take 20-40 mg by mouth daily at 12 noon.   fluticasone 50 MCG/ACT nasal spray Commonly known as:  FLONASE Place 1 spray into both nostrils 2 (two) times daily as needed for allergies or rhinitis.   gabapentin 300 MG capsule Commonly known as:  NEURONTIN Take 600 mg by mouth every  morning.   predniSONE 20 MG tablet Commonly known as:  DELTASONE 2 po at same time daily for 5 days   testosterone cypionate 100 MG/ML injection Commonly known as:  DEPOTESTOTERONE CYPIONATE Inject 100 mg into the muscle every 14 (fourteen) days. For IM use only   traMADol 50 MG tablet Commonly known as:  ULTRAM Take 1 tablet (50 mg total) by mouth 2 (two) times daily.          Objective:    BP 107/60   Pulse 60   Temp 97.1 F (36.2 C) (Oral)   Ht 6' (1.829 m)   Wt 202 lb 12.8 oz (92 kg)   BMI 27.50 kg/m   Wt Readings from Last 3 Encounters:  08/15/16 202 lb 12.8 oz (92 kg)  08/12/16 201 lb (91.2 kg)  05/29/16 206 lb (93.4 kg)     Physical Exam  Constitutional: He is oriented to person, place, and time. He appears well-developed and well-nourished. No distress.  HENT:  Right Ear: Tympanic membrane, external ear and ear canal normal.  Left Ear: Tympanic membrane, external ear and ear canal normal.  Nose: Mucosal edema and rhinorrhea present. No sinus tenderness. No epistaxis. Right sinus exhibits maxillary sinus tenderness. Right sinus exhibits no frontal sinus tenderness. Left sinus exhibits maxillary sinus tenderness. Left sinus exhibits no frontal sinus tenderness.  Mouth/Throat: Uvula is midline and mucous membranes are normal. Posterior oropharyngeal edema and posterior oropharyngeal erythema present. No oropharyngeal exudate or tonsillar abscesses.  Eyes: Conjunctivae are normal. Right eye exhibits no discharge. Left eye exhibits no discharge. No scleral icterus.  Neck: Neck supple. No thyromegaly present.  Cardiovascular: Normal rate, regular rhythm, normal heart sounds and intact distal pulses.   No murmur heard. Pulmonary/Chest: Effort normal. No respiratory distress. He has wheezes. He has no rales.  Musculoskeletal: Normal range of motion. He exhibits no edema.  Lymphadenopathy:    He has no cervical adenopathy.  Neurological: He is alert and oriented to person, place, and time. Coordination normal.  Skin: Skin is warm and dry. No rash noted. He is not diaphoretic.  Psychiatric: He has a normal mood and affect. His behavior is normal.  Nursing note and vitals reviewed.     Assessment & Plan:   Problem List Items Addressed This Visit    None    Visit Diagnoses    Acute bronchitis, unspecified organism    -  Primary   Relevant Medications   predniSONE (DELTASONE) 20 MG tablet   albuterol (PROVENTIL HFA;VENTOLIN HFA) 108 (90 Base) MCG/ACT inhaler       Follow up plan: Return if symptoms worsen or fail to improve.  Counseling provided for all of the vaccine components No orders of the defined  types were placed in this encounter.   Arville Care, MD [facility] 08/15/2016, 11:23 AM

## 2016-09-13 IMAGING — CT CT ABD-PELV W/ CM
2 of 4 series · 16 of 46 positions shown, 18 images · IV contrast (Omnipaque 300)
Comparison: None.

CLINICAL DATA: Right lower abdominal pain for 1 day

EXAM:
CT ABDOMEN AND PELVIS WITH CONTRAST
TECHNIQUE: Multidetector CT imaging of the abdomen and pelvis was performed
using the standard protocol following bolus administration of
intravenous contrast.
CONTRAST:  100mL OMNIPAQUE IOHEXOL 300 MG/ML  SOLN

[Series 2: abd_pel_with 5.0 b40f · axial · 0.69mm/px · z∈[-544,-118]mm · 13 of 95 slices shown, 15 images]
[im 5/95  soft-tissue]
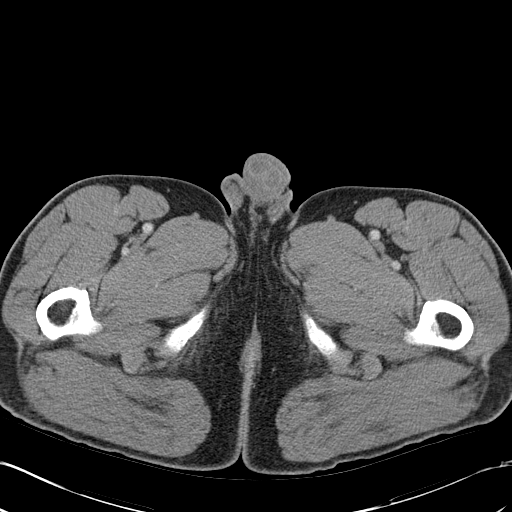
[im 5/95  bone]
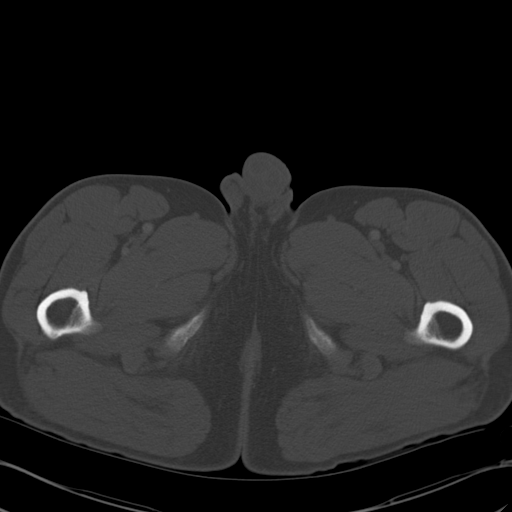
[im 14/95  soft-tissue]
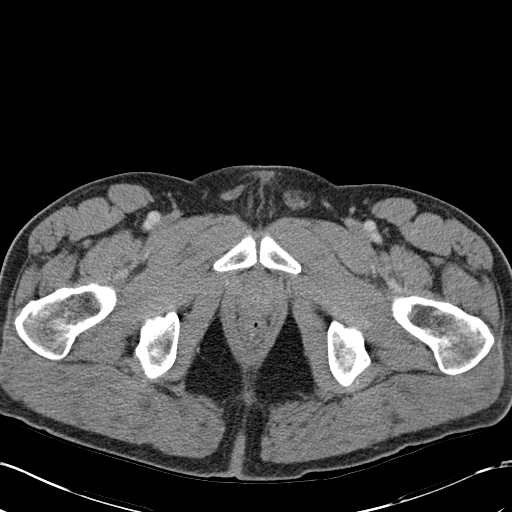
[im 18/95  soft-tissue]
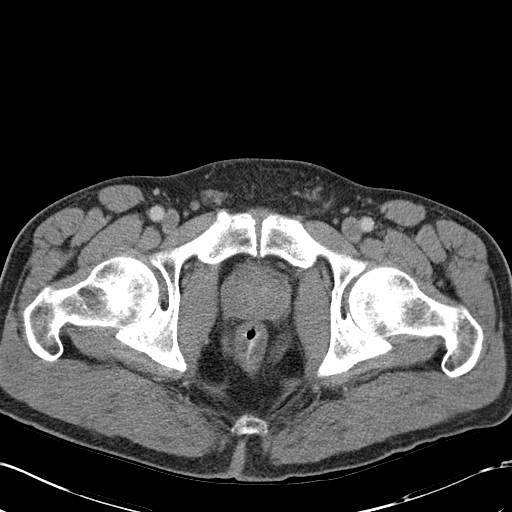
[im 27/95  soft-tissue]
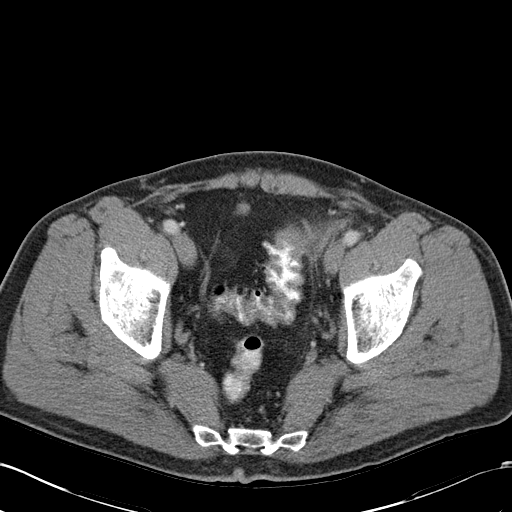
[im 32/95  soft-tissue]
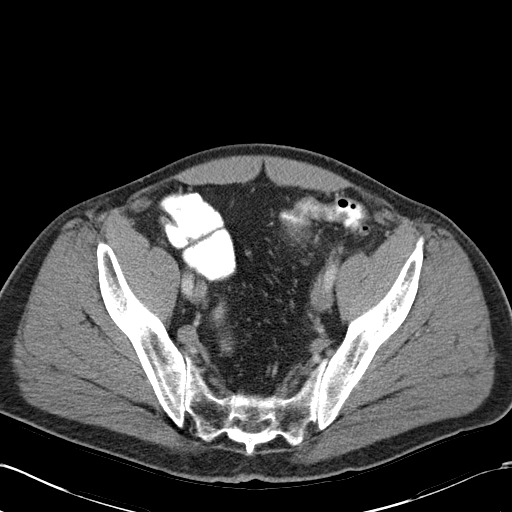
[im 41/95  soft-tissue]
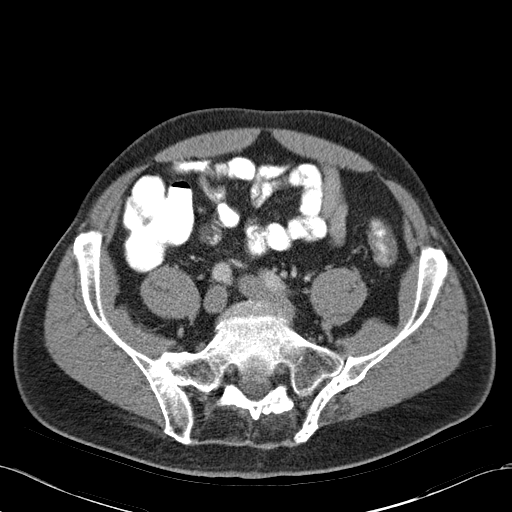
[im 50/95  soft-tissue]
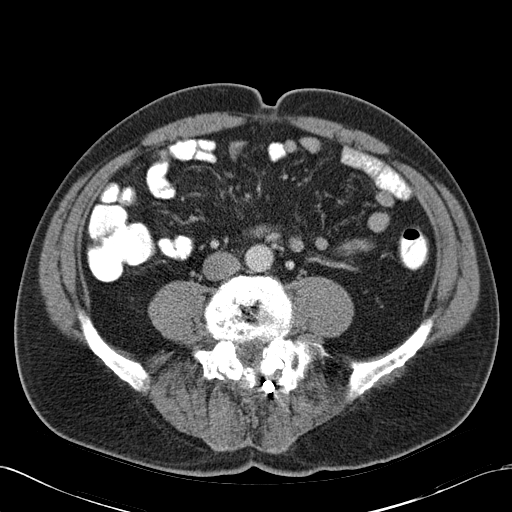
[im 54/95  soft-tissue]
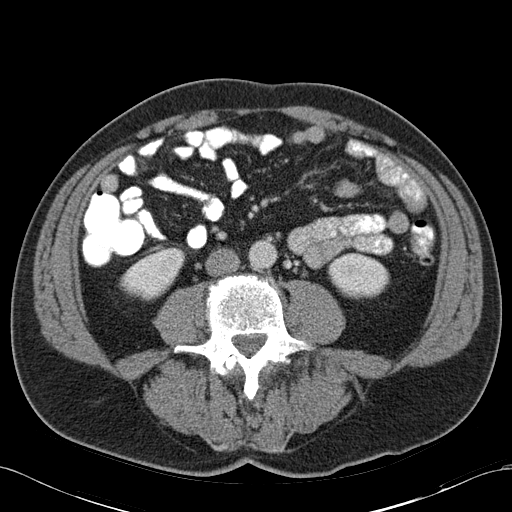
[im 63/95  soft-tissue]
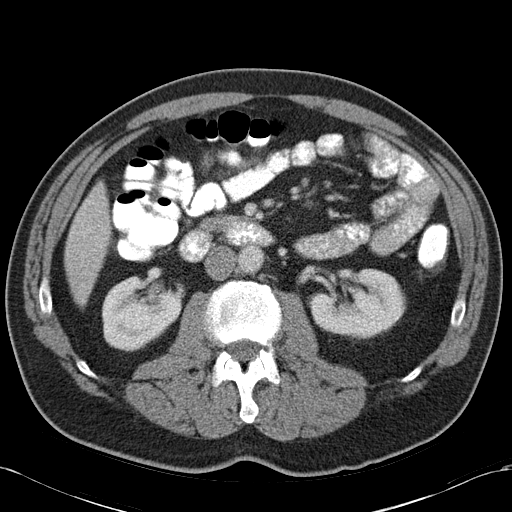
[im 63/95  bone]
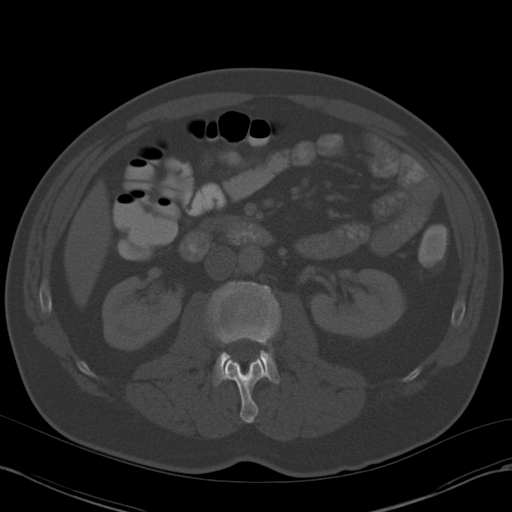
[im 68/95  soft-tissue]
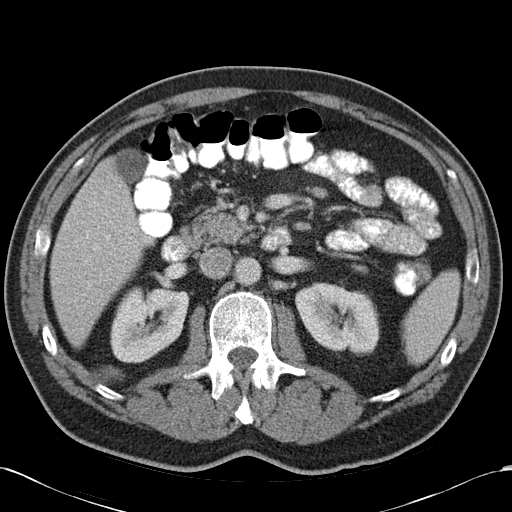
[im 77/95  soft-tissue]
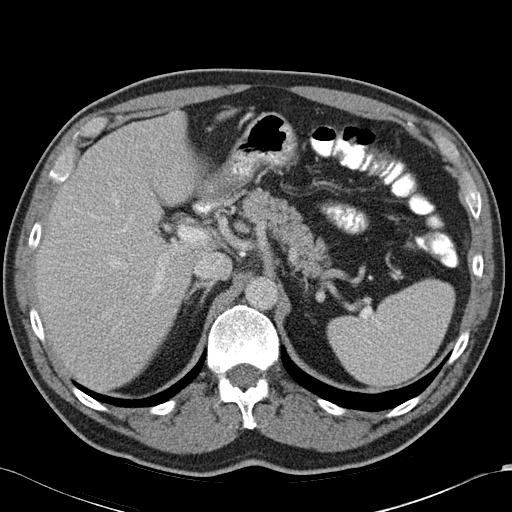
[im 81/95  soft-tissue]
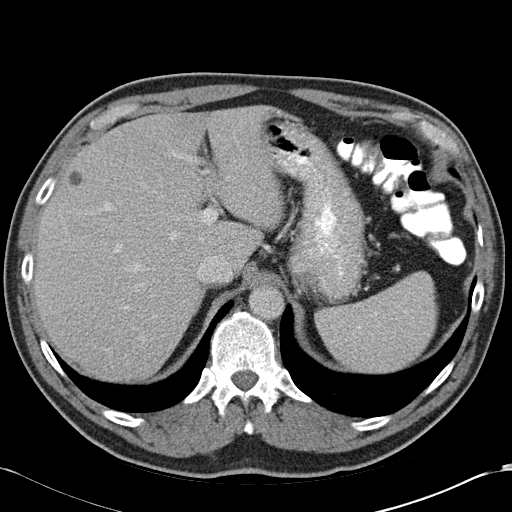
[im 90/95  soft-tissue]
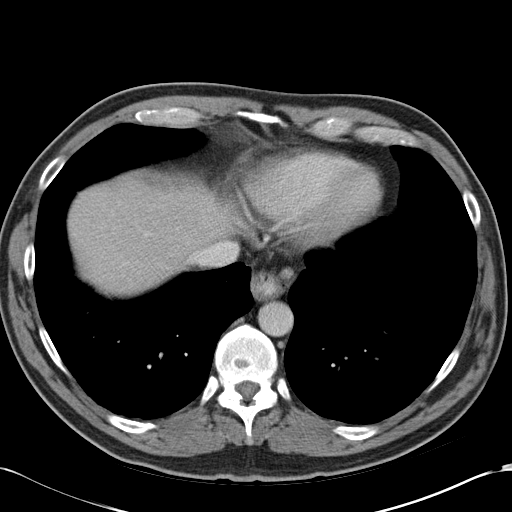

[Series 3: abd_pel_with 3.0 spo cor · coronal · 0.76mm/px · 3 of 89 slices shown]
[im 30/89  soft-tissue]
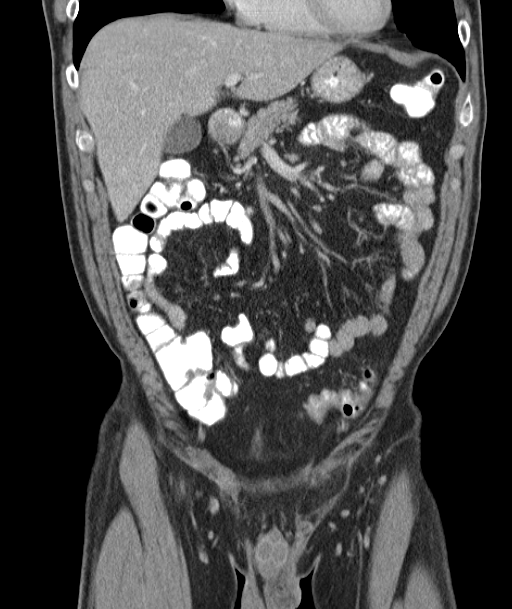
[im 40/89  soft-tissue]
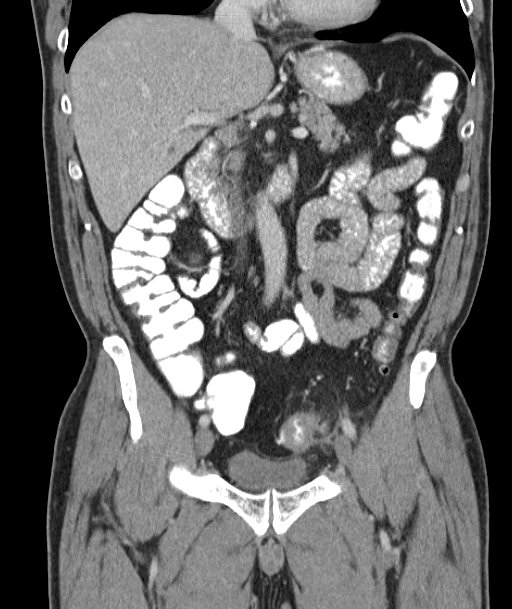
[im 49/89  soft-tissue]
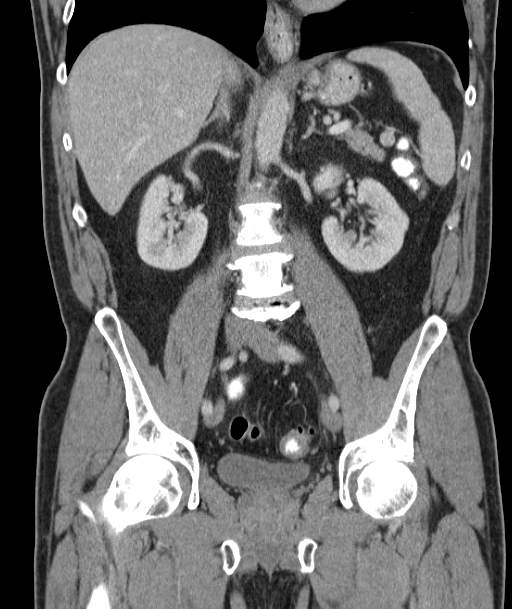

[16 of 46 positions shown; findings below may reference images not displayed]

FINDINGS: Lower chest: No pleural or pericardial effusion identified. The lung
bases are clear.

Hepatobiliary: Several small cysts noted in the right lobe of liver.
The gallbladder is normal. There is no biliary dilatation.

Pancreas: Normal appearance of the pancreas.

Spleen: The spleen is unremarkable.

Adrenals/Urinary Tract: Normal adrenal glands. Normal appearance of
the right kidney. The left kidney is also normal. The urinary
bladder appears normal.

Stomach/Bowel: The stomach appears normal. The small bowel loops
have a normal course and caliber. There is no bowel obstruction. The
proximal colon appears normal. Distal colonic diverticula are
present. There is wall thickening and inflammation involving the
proximal sigmoid colon which is favored to represent acute sigmoid
diverticulitis no free air. No fluid collections identified.

Vascular/Lymphatic: Normal appearance of the abdominal aorta. No
enlarged retroperitoneal or mesenteric adenopathy. No enlarged
pelvic or inguinal lymph nodes.

Reproductive: The prostate gland and the seminal vesicles appear
normal.

Other: There is no ascites or focal fluid collections within the
abdomen or pelvis.

Musculoskeletal: Degenerative disc disease noted within the lumbar
spine. Postop changes involve the lower lumbar spine and sacrum.
IMPRESSION: Findings compatible with acute diverticulitis involving the sigmoid
colon. No free air or abscess.

## 2016-09-20 DIAGNOSIS — Z9689 Presence of other specified functional implants: Secondary | ICD-10-CM

## 2016-09-20 HISTORY — DX: Presence of other specified functional implants: Z96.89

## 2016-10-01 ENCOUNTER — Encounter: Payer: Self-pay | Admitting: Pediatrics

## 2016-10-01 ENCOUNTER — Ambulatory Visit (INDEPENDENT_AMBULATORY_CARE_PROVIDER_SITE_OTHER): Payer: Managed Care, Other (non HMO) | Admitting: Pediatrics

## 2016-10-01 VITALS — BP 130/80 | HR 63 | Temp 97.6°F | Ht 72.0 in | Wt 204.0 lb

## 2016-10-01 DIAGNOSIS — J3089 Other allergic rhinitis: Secondary | ICD-10-CM | POA: Diagnosis not present

## 2016-10-01 DIAGNOSIS — J069 Acute upper respiratory infection, unspecified: Secondary | ICD-10-CM

## 2016-10-01 DIAGNOSIS — Z23 Encounter for immunization: Secondary | ICD-10-CM | POA: Diagnosis not present

## 2016-10-01 DIAGNOSIS — S91331A Puncture wound without foreign body, right foot, initial encounter: Secondary | ICD-10-CM | POA: Diagnosis not present

## 2016-10-01 NOTE — Patient Instructions (Signed)
Netipot with distilled water 2-3 times a day to clear out sinuses Or Normal saline nasal spray Flonase steroid nasal spray Antihistamine daily such as cetirizine Lots of fluids  

## 2016-10-01 NOTE — Progress Notes (Signed)
  Subjective:   Patient ID: Jeffrey Logan, male    DOB: 08/02/1955, 61 y.o.   MRN: [id number] CC: head congestion (no known fever); Sore Throat (uvula swollen & left side of tonsils); and Cough (productive mainly in am)  HPI: Jeffrey Logan is a 61 y.o. male presenting for head congestion (no known fever); Sore Throat (uvula swollen & left side of tonsils); and Cough (productive mainly in am)  flonase daily for sinus problems Past 3 days has had increasing congestion Has been treated for acute sinusitis with antibiotics at least twice over the past few months No facial pain now No fevers Appetite is normal Feeling well overall Sinus drainage bothers him at night and first thing in the morning  Stepped on a screw several days ago, sore at first, now feeling better Did bleed slightly Not sure when last tetanus was  Relevant past medical, surgical, family and social history reviewed. Allergies and medications reviewed and updated. History  Smoking Status  . Current Some Day Smoker  . Packs/day: 0.25  . Years: 15.00  . Types: Cigarettes  . Last attempt to quit: 04/15/2015  Smokeless Tobacco  . Never Used   ROS: Per HPI   Objective:    BP 130/80   Pulse 63   Temp 97.6 F (36.4 C) (Oral)   Ht 6' (1.829 m)   Wt 204 lb (92.5 kg)   BMI 27.67 kg/m   Wt Readings from Last 3 Encounters:  10/01/16 204 lb (92.5 kg)  08/15/16 202 lb 12.8 oz (92 kg)  08/12/16 201 lb (91.2 kg)    Gen: NAD, alert, cooperative with exam, NCAT EYES: EOMI, no conjunctival injection, or no icterus ENT:  TMs pearly gray b/l, OP without erythema, no TTP over sinuses LYMPH: no cervical LAD CV: NRRR, normal S1/S2, no murmur, distal pulses 2+ b/l Resp: CTABL, no wheezes, normal WOB Ext: No edema, warm Neuro: Alert and oriented MSK: normal muscle bulk  Assessment & Plan:  Jeffrey Logan was seen today for head congestion, sore throat and cough.  Diagnoses and all orders for this visit:  Acute URI Discussed  symptomatic care  Allergic rhinitis due to other allergic trigger, unspecified chronicity, unspecified seasonality Flonase, antihistamine every day during allergy season Start sinus rinses with neti pot  Penetrating wound of right foot, initial encounter Tetanus shot given today  Follow up plan: As needed Rex Krasarol Vincent, MD [facility]

## 2016-10-04 ENCOUNTER — Encounter: Payer: Self-pay | Admitting: Family

## 2016-10-04 ENCOUNTER — Ambulatory Visit (INDEPENDENT_AMBULATORY_CARE_PROVIDER_SITE_OTHER): Payer: Managed Care, Other (non HMO) | Admitting: Family

## 2016-10-04 VITALS — BP 138/74 | HR 72 | Temp 97.4°F | Ht 72.0 in | Wt 205.2 lb

## 2016-10-04 DIAGNOSIS — J029 Acute pharyngitis, unspecified: Secondary | ICD-10-CM | POA: Diagnosis not present

## 2016-10-04 LAB — CULTURE, GROUP A STREP

## 2016-10-04 LAB — RAPID STREP SCREEN (MED CTR MEBANE ONLY): STREP GP A AG, IA W/REFLEX: NEGATIVE

## 2016-10-04 MED ORDER — AZITHROMYCIN 250 MG PO TABS: ORAL_TABLET | ORAL | 0 refills | Status: DC

## 2016-10-04 NOTE — Addendum Note (Signed)
Addended by: Fawn KirkHOLT, CATHY on: 10/04/2016 11:14 AM   Modules accepted: Orders

## 2016-10-04 NOTE — Progress Notes (Signed)
   Subjective:    Patient ID: Jeffrey Logan, male    DOB: 02/05/1956, 61 y.o.   MRN: [id number]  Headache   This is a new problem. Associated symptoms include coughing. Pertinent negatives include no ear pain.  Sore Throat   This is a recurrent problem. The current episode started 1 to 4 weeks ago. The problem has been gradually worsening. There has been no fever. The pain is at a severity of 8/10. The pain is moderate. Associated symptoms include congestion, coughing, headaches, a hoarse voice, a plugged ear sensation and trouble swallowing. Pertinent negatives include no ear discharge or ear pain. He has tried acetaminophen for the symptoms. The treatment provided mild relief.      Review of Systems  HENT: Positive for congestion, hoarse voice and trouble swallowing. Negative for ear discharge and ear pain.   Respiratory: Positive for cough.   Neurological: Positive for headaches.  All other systems reviewed and are negative.      Objective:   Physical Exam  Constitutional: He is oriented to person, place, and time. He appears well-developed and well-nourished. No distress.  HENT:  Head: Normocephalic.  Right Ear: External ear normal.  Left Ear: External ear normal.  Nose: Mucosal edema and rhinorrhea present.  Mouth/Throat: Posterior oropharyngeal edema and posterior oropharyngeal erythema present.  Eyes: Pupils are equal, round, and reactive to light. Right eye exhibits no discharge. Left eye exhibits no discharge.  Neck: Normal range of motion. Neck supple. No thyromegaly present.  Cardiovascular: Normal rate, regular rhythm, normal heart sounds and intact distal pulses.   No murmur heard. Pulmonary/Chest: Effort normal and breath sounds normal. No respiratory distress. He has no wheezes.  Intermittent nonproductive cough   Abdominal: Soft. Bowel sounds are normal. He exhibits no distension. There is no tenderness.  Musculoskeletal: Normal range of motion. He exhibits no edema  or tenderness.  Neurological: He is alert and oriented to person, place, and time. He has normal reflexes. No cranial nerve deficit.  Skin: Skin is warm and dry. No rash noted. No erythema.  Psychiatric: He has a normal mood and affect. His behavior is normal. Judgment and thought content normal.  Vitals reviewed.     BP 138/74   Pulse 72   Temp 97.4 F (36.3 C) (Oral)   Ht 6' (1.829 m)   Wt 205 lb 3.2 oz (93.1 kg)   BMI 27.83 kg/m      Assessment & Plan:  1. Sore throat - [facility] ([facility])  2. Acute pharyngitis, unspecified etiology - Take meds as prescribed - Use a cool mist humidifier  -Use saline nose sprays frequently -Saline irrigations of the nose can be very helpful if done frequently.  * 4X daily for 1 week*  * Use of a nettie pot can be helpful with this. Follow directions with this* -Force fluids -For any cough or congestion  Use plain Mucinex- regular strength or max strength is fine   * Children- consult with Pharmacist for dosing -For fever or aces or pains- take tylenol or ibuprofen appropriate for age and weight.  * for fevers greater than 101 orally you may alternate ibuprofen and tylenol every  3 hours. -Throat lozenges if help -New toothbrush in 3 days - azithromycin (ZITHROMAX) 250 MG tablet; Take 500 mg once, then 250 mg for four days  Dispense: 6 tablet; Refill: 0   Jannifer Rodneyhristy Dorrien Grunder, FNP

## 2016-10-04 NOTE — Patient Instructions (Signed)

## 2016-10-20 ENCOUNTER — Ambulatory Visit (INDEPENDENT_AMBULATORY_CARE_PROVIDER_SITE_OTHER): Payer: Managed Care, Other (non HMO) | Admitting: Family Medicine

## 2016-10-20 ENCOUNTER — Encounter: Payer: Self-pay | Admitting: Family Medicine

## 2016-10-20 VITALS — BP 138/82 | HR 79 | Temp 97.4°F | Ht 72.0 in | Wt 204.0 lb

## 2016-10-20 DIAGNOSIS — W57XXXA Bitten or stung by nonvenomous insect and other nonvenomous arthropods, initial encounter: Secondary | ICD-10-CM | POA: Diagnosis not present

## 2016-10-20 DIAGNOSIS — J209 Acute bronchitis, unspecified: Secondary | ICD-10-CM | POA: Diagnosis not present

## 2016-10-20 DIAGNOSIS — S30861A Insect bite (nonvenomous) of abdominal wall, initial encounter: Secondary | ICD-10-CM | POA: Diagnosis not present

## 2016-10-20 MED ORDER — DOXYCYCLINE HYCLATE 100 MG PO TABS: 100.0000 mg | ORAL_TABLET | Freq: Two times a day (BID) | ORAL | 0 refills | Status: DC

## 2016-10-20 NOTE — Progress Notes (Signed)
BP 138/82   Pulse 79   Temp 97.4 F (36.3 C) (Oral)   Ht 6' (1.829 m)   Wt 204 lb (92.5 kg)   BMI 27.67 kg/m    Subjective:    Patient ID: Jeffrey Logan, male    DOB: 1955/10/24, 61 y.o.   MRN: [id number]  HPI: Jeffrey Logan is a 61 y.o. male presenting on 10/20/2016 for Tick Removal (on abdomen, found it yesterday, area was red and had a bulls eye rash, joint pain in elbows, nauseous) and Bronchitis (x 4 weeks, chest congestion, burning, cough)   HPI Tick bite on abdomen Patient has a tick bite on his lower abdomen that he found just last night. He thinks the tick may have been on and since about 10 days ago because that's when he was outside working in his yard when it was warm. He removed the tick just last night and got the head out and everything but then says he had a bull's-eye rash right around the area that has since resolved since last night. He denies any fevers or chills or body aches but does have some nausea and generally ill feeling.  Cough and congestion Patient has been having cough and congestion for 4 weeks and also burning in his chest associated with that cough and congestion. His cough is mostly dry and nonproductive. He did start having some sinus pressure and drainage initially as well. He denies any fevers or chills or shortness of breath and wheezing.  Relevant past medical, surgical, family and social history reviewed and updated as indicated. Interim medical history since our last visit reviewed. Allergies and medications reviewed and updated.  Review of Systems  Constitutional: Negative for chills and fever.  HENT: Positive for congestion, postnasal drip, rhinorrhea, sinus pressure, sneezing and sore throat. Negative for ear discharge, ear pain and voice change.   Eyes: Negative for pain, discharge, redness and visual disturbance.  Respiratory: Positive for cough and chest tightness. Negative for shortness of breath and wheezing.   Cardiovascular: Negative for  chest pain and leg swelling.  Musculoskeletal: Negative for back pain and gait problem.  Skin: Positive for rash.  All other systems reviewed and are negative.   Per HPI unless specifically indicated above     Objective:    BP 138/82   Pulse 79   Temp 97.4 F (36.3 C) (Oral)   Ht 6' (1.829 m)   Wt 204 lb (92.5 kg)   BMI 27.67 kg/m   Wt Readings from Last 3 Encounters:  10/20/16 204 lb (92.5 kg)  10/04/16 205 lb 3.2 oz (93.1 kg)  10/01/16 204 lb (92.5 kg)    Physical Exam  Constitutional: He is oriented to person, place, and time. He appears well-developed and well-nourished. No distress.  HENT:  Right Ear: Tympanic membrane, external ear and ear canal normal.  Left Ear: Tympanic membrane, external ear and ear canal normal.  Nose: Mucosal edema and rhinorrhea present. No sinus tenderness. No epistaxis. Right sinus exhibits maxillary sinus tenderness. Right sinus exhibits no frontal sinus tenderness. Left sinus exhibits maxillary sinus tenderness. Left sinus exhibits no frontal sinus tenderness.  Mouth/Throat: Uvula is midline and mucous membranes are normal. Posterior oropharyngeal edema and posterior oropharyngeal erythema present. No oropharyngeal exudate or tonsillar abscesses.  Eyes: Conjunctivae are normal. No scleral icterus.  Neck: Neck supple. No thyromegaly present.  Cardiovascular: Normal rate, regular rhythm, normal heart sounds and intact distal pulses.   No murmur heard. Pulmonary/Chest: Effort normal. No respiratory  distress. He has no decreased breath sounds. He has no wheezes. He has rhonchi. He has no rales.  Musculoskeletal: Normal range of motion. He exhibits no edema.  Lymphadenopathy:    He has no cervical adenopathy.  Neurological: He is alert and oriented to person, place, and time. Coordination normal.  Skin: Skin is warm and dry. Rash (Small eschar on anterior left lower abdomen near belt line. No surrounding rash noted today. No erythema or  induration.) noted. He is not diaphoretic.  Psychiatric: He has a normal mood and affect. His behavior is normal.  Nursing note and vitals reviewed.     Assessment & Plan:   Problem List Items Addressed This Visit    None    Visit Diagnoses    Tick bite of abdomen, initial encounter    -  Primary   Relevant Medications   doxycycline (VIBRA-TABS) 100 MG tablet   Acute bronchitis, unspecified organism       Relevant Medications   doxycycline (VIBRA-TABS) 100 MG tablet       Follow up plan: Return if symptoms worsen or fail to improve.  Counseling provided for all of the vaccine components No orders of the defined types were placed in this encounter.   Arville CareJoshua Dettinger, MD [facility] 10/20/2016, 1:55 PM

## 2016-12-29 ENCOUNTER — Telehealth: Payer: Self-pay | Admitting: *Deleted

## 2016-12-29 DIAGNOSIS — S30861A Insect bite (nonvenomous) of abdominal wall, initial encounter: Secondary | ICD-10-CM

## 2016-12-29 DIAGNOSIS — W57XXXA Bitten or stung by nonvenomous insect and other nonvenomous arthropods, initial encounter: Principal | ICD-10-CM

## 2016-12-29 DIAGNOSIS — J209 Acute bronchitis, unspecified: Secondary | ICD-10-CM

## 2016-12-29 MED ORDER — DOXYCYCLINE HYCLATE 100 MG PO TABS: 100.0000 mg | ORAL_TABLET | Freq: Two times a day (BID) | ORAL | 0 refills | Status: DC

## 2016-12-29 NOTE — Telephone Encounter (Signed)
Pt aware.

## 2016-12-29 NOTE — Telephone Encounter (Signed)
Pt had a large tick removed yesterday. Can he get another antibiotic call in or does he need to be seen?

## 2017-04-03 IMAGING — DX DG FINGER RING 2+V*L*
3 series · 3 of 3 positions shown · non-contrast
Comparison: No prior.

CLINICAL DATA: Injury.

EXAM:
LEFT RING FINGER 2+V

[finger ap]
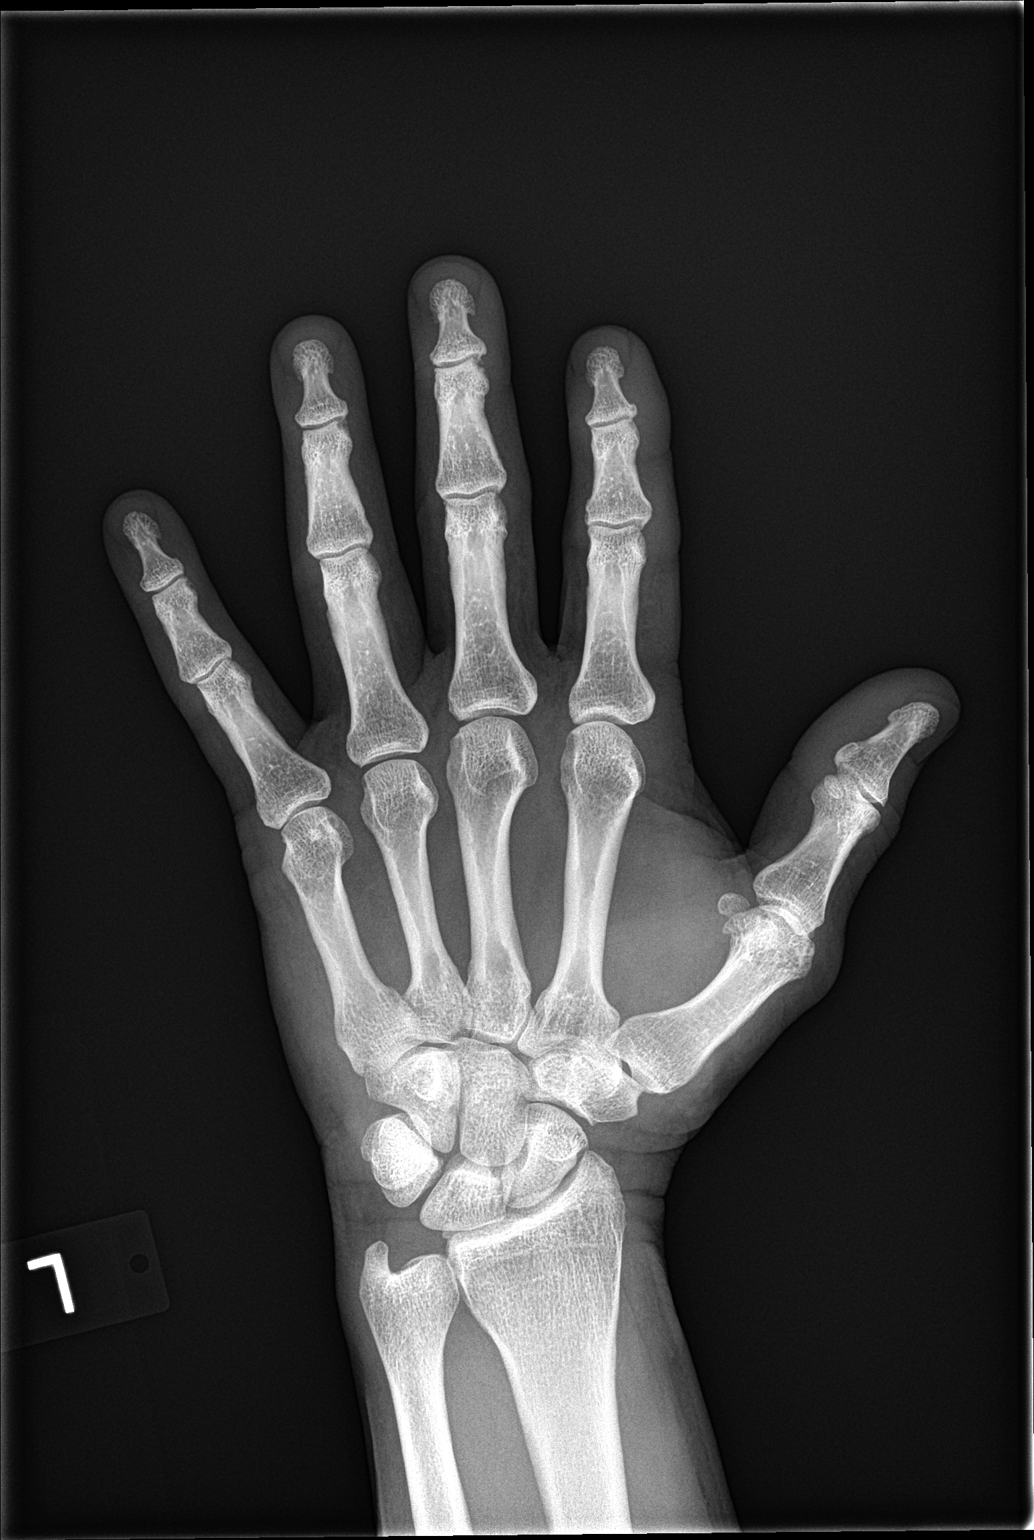

[finger obl]
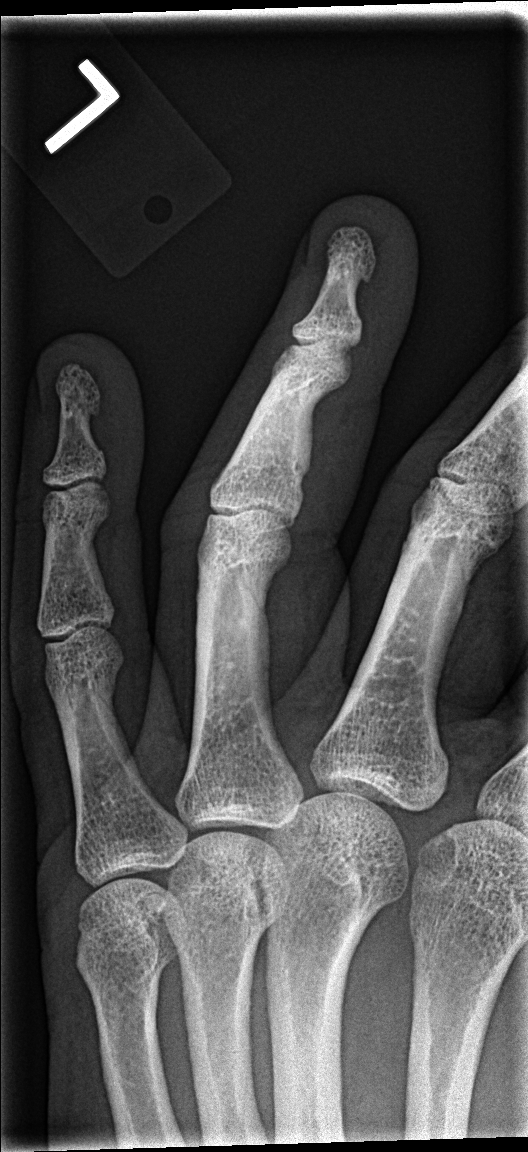

[finger lat]
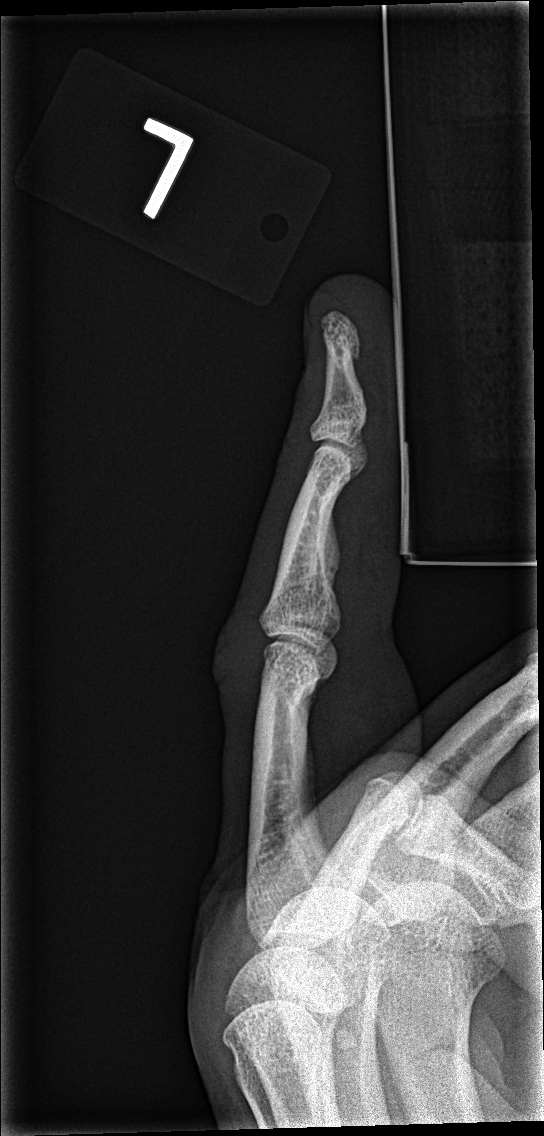

[3 of 3 positions shown; findings below may reference images not displayed]

FINDINGS: Mild diffuse degenerative change. Several very mild erosive changes
noted about the distal interphalangeal joints. These changes could
be secondary to inflammatory arthropathy. No evidence of fracture or
dislocation. No radiopaque foreign body .
IMPRESSION: 1. Diffuse degenerative changes. Mild erosive changes noted about
distal interphalangeal joints. Mild erosive arthropathy cannot be
excluded.

2. No acute bony abnormality identified. No evidence of fracture,
dislocation, or soft tissue foreign body.

## 2017-04-28 ENCOUNTER — Ambulatory Visit (INDEPENDENT_AMBULATORY_CARE_PROVIDER_SITE_OTHER): Payer: Managed Care, Other (non HMO) | Admitting: Physician Assistant

## 2017-04-28 ENCOUNTER — Encounter: Payer: Self-pay | Admitting: Physician Assistant

## 2017-04-28 VITALS — BP 136/79 | HR 49 | Temp 98.6°F | Ht 72.0 in | Wt 204.2 lb

## 2017-04-28 DIAGNOSIS — J301 Allergic rhinitis due to pollen: Secondary | ICD-10-CM | POA: Diagnosis not present

## 2017-04-28 DIAGNOSIS — H61892 Other specified disorders of left external ear: Secondary | ICD-10-CM

## 2017-04-28 MED ORDER — LORATADINE 10 MG PO TABS: 10.0000 mg | ORAL_TABLET | Freq: Every day | ORAL | 5 refills | Status: DC

## 2017-04-28 NOTE — Patient Instructions (Signed)
In a few days you may receive a survey in the mail or online from [employer] regarding your visit with us today. Please take a moment to fill this out. Your feedback is very important to our whole office. It can help us better understand your needs as well as improve your experience and satisfaction. Thank you for taking your time to complete it. We care about you.  Lucetta Baehr, PA-C  

## 2017-04-28 NOTE — Progress Notes (Signed)
BP 136/79   Pulse (!) 49   Temp 98.6 F (37 C) (Oral)   Ht 6' (1.829 m)   Wt 204 lb 3.2 oz (92.6 kg)   BMI 27.69 kg/m    Subjective:    Patient ID: Jeffrey Logan, male    DOB: Apr 08, 1956, 61 y.o.   MRN: [id number]  HPI: Jeffrey Logan is a 61 y.o. male presenting on 04/28/2017 for blood in left ear  Earlier today the patient noticed a small amount of blood on the Q-tip that he had removed from his left ear. He has noticed some slight spotting of blood on his pillowcase over the past week. He does not have any pain in the ears. He does use peroxide MM. He is known to have allergic rhinitis.  Relevant past medical, surgical, family and social history reviewed and updated as indicated. Allergies and medications reviewed and updated.  Past Medical History:  Diagnosis Date  . GERD (gastroesophageal reflux disease)   . Hypertension   . Sleep apnea 2012   wears a cpap at night  . 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 BACK SURGERY     screw placement  . LUMBAR DISC SURGERY    . LUMBAR FUSION     L5-S1  . RIB FRACTURE SURGERY  2002   removal of left 9th rib  . ROTATOR CUFF REPAIR  12/25/14   right shoulder  . SHOULDER SURGERY Right 08/10/2015    Review of Systems  Constitutional: Negative.  Negative for appetite change, fatigue and fever.  HENT: Negative for ear discharge, ear pain, facial swelling and tinnitus.   Eyes: Negative for pain and visual disturbance.  Respiratory: Negative.  Negative for cough, chest tightness, shortness of breath and wheezing.   Cardiovascular: Negative.  Negative for chest pain, palpitations and leg swelling.  Gastrointestinal: Negative.  Negative for abdominal pain, diarrhea, nausea and vomiting.  Genitourinary: Negative.   Skin: Negative.  Negative for color change and rash.  Neurological: Negative.  Negative for weakness, numbness and headaches.  Psychiatric/Behavioral:  Negative.     Allergies as of 04/28/2017      Reactions   Valium [diazepam] Other (See Comments)   hypotension      Medication List       Accurate as of 04/28/17  6:03 PM. Always use your most recent med list.          amLODipine 10 MG tablet Commonly known as:  NORVASC TAKE 1 TABLET BY MOUTH DAILY   diclofenac 75 MG EC tablet Commonly known as:  VOLTAREN Take 1 tablet (75 mg total) by mouth 2 (two) times daily.   escitalopram 20 MG tablet Commonly known as:  LEXAPRO Take 20 mg by mouth daily.   esomeprazole 20 MG capsule Commonly known as:  NEXIUM Take 20-40 mg by mouth daily at 12 noon.   fluticasone 50 MCG/ACT nasal spray Commonly known as:  FLONASE Place 1 spray into both nostrils 2 (two) times daily as needed for allergies or rhinitis.   gabapentin 300 MG capsule Commonly known as:  NEURONTIN Take 600 mg by mouth every morning.   loratadine 10 MG tablet Commonly known as:  CLARITIN Take 1 tablet (10 mg total) by mouth daily.   SYRINGE 3CC/25GX1-1/2" 25G X 1-1/2" 3 ML Misc by Does not apply route.   testosterone cypionate 100 MG/ML injection Commonly known as:  DEPOTESTOTERONE CYPIONATE Inject 100 mg into  the muscle every 14 (fourteen) days. For IM use only   traMADol 50 MG tablet Commonly known as:  ULTRAM Take 1 tablet (50 mg total) by mouth 2 (two) times daily.            Discharge Care Instructions        Start     Ordered   04/28/17 0000  loratadine (CLARITIN) 10 MG tablet  Daily    Question:  Supervising Provider  Answer:  Elenora Gamma   04/28/17 1715         Objective:    BP 136/79   Pulse (!) 49   Temp 98.6 F (37 C) (Oral)   Ht 6' (1.829 m)   Wt 204 lb 3.2 oz (92.6 kg)   BMI 27.69 kg/m   Allergies  Allergen Reactions  . Valium [Diazepam] Other (See Comments)    hypotension    Physical Exam  Constitutional: He appears well-developed and well-nourished. No distress.  HENT:  Head: Normocephalic and atraumatic.    Right Ear: Hearing, tympanic membrane, external ear and ear canal normal.  Left Ear: No drainage, swelling or tenderness. A middle ear effusion is present.  Ears:  Redness and dryness in the upper portion of the left ear canal.  Eyes: Pupils are equal, round, and reactive to light. Conjunctivae and EOM are normal.  Cardiovascular: Normal rate, regular rhythm and normal heart sounds.   Pulmonary/Chest: Effort normal and breath sounds normal. No respiratory distress.  Skin: Skin is warm and dry.  Psychiatric: He has a normal mood and affect. His behavior is normal.  Nursing note and vitals reviewed.       Assessment & Plan:   1. Allergic rhinitis due to pollen, unspecified seasonality - loratadine (CLARITIN) 10 MG tablet; Take 1 tablet (10 mg total) by mouth daily.  Dispense: 30 tablet; Refill: 5  2. Ear canal dryness, left Sweet oil, avoid Hydrogen peroxide Recheck 2 weeks with PCP    Current Outpatient Prescriptions:  .  amLODipine (NORVASC) 10 MG tablet, TAKE 1 TABLET BY MOUTH DAILY, Disp: 90 tablet, Rfl: 0 .  diclofenac (VOLTAREN) 75 MG EC tablet, Take 1 tablet (75 mg total) by mouth 2 (two) times daily. (Patient taking differently: Take 75 mg by mouth daily. ), Disp: 180 tablet, Rfl: 0 .  escitalopram (LEXAPRO) 20 MG tablet, Take 20 mg by mouth daily., Disp: , Rfl:  .  esomeprazole (NEXIUM) 20 MG capsule, Take 20-40 mg by mouth daily at 12 noon., Disp: , Rfl:  .  fluticasone (FLONASE) 50 MCG/ACT nasal spray, Place 1 spray into both nostrils 2 (two) times daily as needed for allergies or rhinitis., Disp: 16 g, Rfl: 6 .  gabapentin (NEURONTIN) 300 MG capsule, Take 600 mg by mouth every morning. , Disp: , Rfl:  .  Syringe/Needle, Disp, (SYRINGE 3CC/25GX1-1/2") 25G X 1-1/2" 3 ML MISC, by Does not apply route., Disp: , Rfl:  .  testosterone cypionate (DEPOTESTOTERONE CYPIONATE) 100 MG/ML injection, Inject 100 mg into the muscle every 14 (fourteen) days. For IM use only , Disp: ,  Rfl:  .  traMADol (ULTRAM) 50 MG tablet, Take 1 tablet (50 mg total) by mouth 2 (two) times daily., Disp: 60 tablet, Rfl: 0 .  loratadine (CLARITIN) 10 MG tablet, Take 1 tablet (10 mg total) by mouth daily., Disp: 30 tablet, Rfl: 5 Continue all other maintenance medications as listed above.  Follow up plan: Return if symptoms worsen or fail to improve.  Educational handout given  for survey  Remus Loffler PA-C [facility] [address]  [place], [place] [address] [phone]   04/28/2017, 6:03 PM

## 2017-05-17 ENCOUNTER — Ambulatory Visit (INDEPENDENT_AMBULATORY_CARE_PROVIDER_SITE_OTHER): Payer: Managed Care, Other (non HMO)

## 2017-05-17 DIAGNOSIS — Z23 Encounter for immunization: Secondary | ICD-10-CM | POA: Diagnosis not present

## 2017-07-13 ENCOUNTER — Telehealth: Payer: Self-pay | Admitting: Family Medicine

## 2017-07-14 ENCOUNTER — Ambulatory Visit: Payer: Managed Care, Other (non HMO) | Admitting: Family Medicine

## 2017-07-14 ENCOUNTER — Encounter: Payer: Self-pay | Admitting: Family Medicine

## 2017-07-14 VITALS — BP 138/88 | HR 66 | Temp 97.3°F | Ht 73.0 in | Wt 211.4 lb

## 2017-07-14 DIAGNOSIS — S32009A Unspecified fracture of unspecified lumbar vertebra, initial encounter for closed fracture: Secondary | ICD-10-CM

## 2017-07-14 DIAGNOSIS — S32009D Unspecified fracture of unspecified lumbar vertebra, subsequent encounter for fracture with routine healing: Secondary | ICD-10-CM

## 2017-07-14 MED ORDER — HYDROCODONE-ACETAMINOPHEN 5-325 MG PO TABS: 1.0000 | ORAL_TABLET | Freq: Four times a day (QID) | ORAL | 0 refills | Status: DC | PRN

## 2017-07-14 NOTE — Progress Notes (Signed)
   HPI  Patient presents today with right-sided back pain.  Patient was seen at an outside facility after a tree fell on him 9 days ago.  He states that they were cutting down the tree, the wind blew in an unexpected way and the tree came down landing on his right posterior buttock. He was found to have L3-L4 spinal stenosis which is expected to be old, however he did have new right L1 and L2 transverse process fractures.  He was instructed to follow-up with orthopedics but has not followed up yet. He gets his usual care from the [facility].  Was given hydrocodone in the emergency room but has finished all of his pills.  States that his pain has become more severe in the last 2-3 days. No problems walking, no bowel or bladder dysfunction, no saddle anesthesia.   PMH: Smoking status noted ROS: Per HPI  Objective: BP 138/88 (BP Location: Left Arm, Patient Position: Sitting, Cuff Size: Normal)   Pulse 66   Temp (!) 97.3 F (36.3 C) (Oral)   Ht 6\' 1"  (1.854 m)   Wt 211 lb 6.4 oz (95.9 kg)   BMI 27.89 kg/m  Gen: NAD, alert, cooperative with exam HEENT: NCAT CV: RRR, good S1/S2, no murmur Resp: CTABL, no wheezes, non-labored Ext: No edema, warm Neuro: Alert and oriented, 2+ patellar tendon reflexes bilaterally, normal gait  Right low back with resolving bruising and a large area approximately 14 inches x 8 inches  Assessment and plan:  #Closed transverse process fracture of the lumbar vertebra at L1 and L2. Patient's exam is reassuring, he does have impressive bruising. No red flags on exam or history His pain is uncontrolled, have given him a short course of hydrocodone to try for pain. Of also recommended that he contact the [facility] as soon as possible to try and arrange an orthopedic follow-up. We have reviewed red flags and reasons to seek emergency medical care  The [place] controlled substance database reviewed, patient receives tramadol chronically from the [facility].  Meds ordered  this encounter  Medications  . HYDROcodone-acetaminophen (NORCO) 5-325 MG tablet    Sig: Take 1 tablet by mouth every 6 (six) hours as needed for moderate pain.    Dispense:  28 tablet    Refill:  0    Murtis SinkSam Bradshaw, MD [facility] 07/14/2017, 6:07 PM

## 2017-07-14 NOTE — Patient Instructions (Signed)
Great to see you!  Come back or call with any concerns, cancel Dr. Darrol Pokeettinger's appointment only after you are satisfied [facility] are [facility]

## 2017-07-19 ENCOUNTER — Ambulatory Visit: Payer: Managed Care, Other (non HMO) | Admitting: Family Medicine

## 2017-08-03 ENCOUNTER — Emergency Department (HOSPITAL_COMMUNITY): Payer: Non-veteran care

## 2017-08-03 ENCOUNTER — Emergency Department (HOSPITAL_COMMUNITY)
Admission: EM | Admit: 2017-08-03 | Discharge: 2017-08-04 | Disposition: A | Discharge status: Home / Self Care | Payer: Non-veteran care | Attending: Emergency Medicine | Admitting: Emergency Medicine

## 2017-08-03 ENCOUNTER — Encounter (HOSPITAL_COMMUNITY): Payer: Self-pay | Admitting: Emergency Medicine

## 2017-08-03 DIAGNOSIS — R0602 Shortness of breath: Secondary | ICD-10-CM | POA: Insufficient documentation

## 2017-08-03 DIAGNOSIS — R0789 Other chest pain: Secondary | ICD-10-CM | POA: Diagnosis not present

## 2017-08-03 DIAGNOSIS — Z87891 Personal history of nicotine dependence: Secondary | ICD-10-CM | POA: Diagnosis not present

## 2017-08-03 DIAGNOSIS — R079 Chest pain, unspecified: Secondary | ICD-10-CM

## 2017-08-03 DIAGNOSIS — I10 Essential (primary) hypertension: Secondary | ICD-10-CM | POA: Insufficient documentation

## 2017-08-03 LAB — CBC
HCT: 46.9 % (ref 39.0–52.0)
Hemoglobin: 15.9 g/dL (ref 13.0–17.0)
MCH: 32.3 pg (ref 26.0–34.0)
MCHC: 33.9 g/dL (ref 30.0–36.0)
MCV: 95.1 fL (ref 78.0–100.0)
Platelets: 200 10*3/uL (ref 150–400)
RBC: 4.93 MIL/uL (ref 4.22–5.81)
RDW: 13.2 % (ref 11.5–15.5)
WBC: 6.4 10*3/uL (ref 4.0–10.5)

## 2017-08-03 LAB — BASIC METABOLIC PANEL
Anion gap: 10 (ref 5–15)
BUN: 16 mg/dL (ref 6–20)
CALCIUM: 9.8 mg/dL (ref 8.9–10.3)
CHLORIDE: 103 mmol/L (ref 101–111)
CO2: 27 mmol/L (ref 22–32)
CREATININE: 1.17 mg/dL (ref 0.61–1.24)
GFR calc non Af Amer: >60 mL/min (ref >60)
Glucose, Bld: 107 mg/dL — ABNORMAL HIGH (ref 65–99)
Potassium: 3.9 mmol/L (ref 3.5–5.1)
SODIUM: 140 mmol/L (ref 135–145)

## 2017-08-03 LAB — TROPONIN I: Troponin I: <0.03 ng/mL (ref <0.03)

## 2017-08-03 MED ORDER — IPRATROPIUM-ALBUTEROL 0.5-2.5 (3) MG/3ML IN SOLN
3.0000 mL | RESPIRATORY_TRACT | Status: DC
  Administered 2017-08-03: 3 mL via RESPIRATORY_TRACT
  Filled 2017-08-03: qty 3

## 2017-08-03 MED ORDER — FENTANYL CITRATE (PF) 100 MCG/2ML IJ SOLN
100.0000 ug | Freq: Once | INTRAMUSCULAR | Status: AC
  Administered 2017-08-03: 100 ug via INTRAVENOUS
  Filled 2017-08-03: qty 2

## 2017-08-03 MED ORDER — ASPIRIN 81 MG PO CHEW
324.0000 mg | CHEWABLE_TABLET | Freq: Once | ORAL | Status: DC
  Filled 2017-08-03: qty 4

## 2017-08-03 MED ORDER — IOPAMIDOL (ISOVUE-370) INJECTION 76%
120.0000 mL | Freq: Once | INTRAVENOUS | Status: AC | PRN
  Administered 2017-08-03: 120 mL via INTRAVENOUS

## 2017-08-03 NOTE — ED Notes (Signed)
MD at bedside. 

## 2017-08-03 NOTE — ED Triage Notes (Signed)
Patient complaining of severe chest pain with difficulty breathing since yesterday.

## 2017-08-03 NOTE — ED Notes (Signed)
Pt given urinal.

## 2017-08-03 NOTE — ED Provider Notes (Signed)
Emergency Department Provider Note   I have reviewed the triage vital signs and the nursing notes.   HISTORY  Chief Complaint Chest Pain   HPI Jeffrey Logan is a 62 y.o. male with history of hypertension and past history of smoking presents the emergency department today secondary to chest pain and dyspnea.  Patient states that started yesterday relatively acutely with chest pain, upper back pain and then started radiating towards her shoulders and started having abdominal pain as well.  Around the same time he started become severely dyspneic and then he has developed a headache as well.  No fever or cough.  No lower extremity swelling.  Never anything like this before. No history of connective tissue disorders.  No other associated or modifying symptoms.    Past Medical History:  Diagnosis Date  . GERD (gastroesophageal reflux disease)   . Hypertension   . Sleep apnea 2012   wears a cpap at night  . 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 Anxiety and depression 08/24/2015  . Low testosterone 04/22/2015  . GERD (gastroesophageal reflux disease) 04/22/2015  . Barrett's esophagus 04/22/2015  . Essential hypertension, benign 04/22/2015    Past Surgical History:  Procedure Laterality Date  . BACK SURGERY     screw placement  . LUMBAR DISC SURGERY    . LUMBAR FUSION     L5-S1  . RIB FRACTURE SURGERY  2002   removal of left 9th rib  . ROTATOR CUFF REPAIR  12/25/14   right shoulder  . SHOULDER SURGERY Right 08/10/2015    Current Outpatient Rx  . Order #: [id number] Class: Normal  . Order #: [id number] Class: Historical Med  . Order #: [id number] Class: Historical Med  . Order #: [id number] Class: Historical Med  . Order #: [id number] Class: Historical Med  . Order #: [id number] Class: Normal  . Order #: [id number] Class: Historical Med  . Order #: [id number] Class: Historical Med  . Order #:  [id number] Class: Historical Med  . Order #: [id number] Class: Print  . Order #: [id number] Class: Historical Med  . Order #: [id number] Class: Historical Med    Allergies Valium [diazepam]  Family History  Problem Relation Age of Onset  . Diabetes Mother   . Heart disease Father   . Heart disease Brother     Social History Social History   Tobacco Use  . Smoking status: Former Smoker    Packs/day: 0.25    Years: 15.00    Pack years: 3.75    Types: Cigarettes    Last attempt to quit: 04/15/2015    Years since quitting: 2.3  . Smokeless tobacco: Never Used  Substance Use Topics  . Alcohol use: No    Alcohol/week: 3.0 oz    Types: 1 Cans of beer, 4 Standard drinks or equivalent per week    Frequency: Never  . Drug use: No    Review of Systems  All other systems negative except as documented in the HPI. All pertinent positives and negatives as reviewed in the HPI. ____________________________________________   PHYSICAL EXAM:  VITAL SIGNS: ED Triage Vitals  Enc Vitals Group     BP 08/03/17 1835 (!) 146/95     Pulse Rate 08/03/17 1835 74     Resp 08/03/17 1835 (!) 24     Temp 08/03/17 1835 97.9 F (36.6 C)     Temp Source 08/03/17 1835 Oral     SpO2 08/03/17 1835 98 %  Weight 08/03/17 1834 214 lb (97.1 kg)     Height 08/03/17 1834 5\' 11"  (1.803 m)    Constitutional: Alert and oriented. Well appearing and in no acute distress. Eyes: Conjunctivae are normal. PERRL. EOMI. Head: Atraumatic. Nose: No congestion/rhinnorhea. Mouth/Throat: Mucous membranes are moist.  Oropharynx non-erythematous. Neck: No stridor.  No meningeal signs.   Cardiovascular: Normal rate, regular rhythm. Good peripheral circulation. Grossly normal heart sounds.   Respiratory: Normal respiratory effort.  No retractions. Lungs CTAB. Gastrointestinal: Soft and nontender. No distention.  Musculoskeletal: No lower extremity tenderness nor edema. No gross deformities of extremities. Neurologic:   Normal speech and language. No gross focal neurologic deficits are appreciated.  Skin:  Skin is warm, dry and intact. No rash noted.   ____________________________________________   LABS (all labs ordered are listed, but only abnormal results are displayed)  Labs Reviewed  BASIC METABOLIC PANEL - Abnormal; Notable for the following components:      Result Value   Glucose, Bld 107 (*)    All other components within normal limits  CBC  TROPONIN I  TROPONIN I   ____________________________________________  EKG   EKG Interpretation  Date/Time:  Thursday August 03 2017 18:31:25 EST Ventricular Rate:  75 PR Interval:  140 QRS Duration: 100 QT Interval:  378 QTC Calculation: 422 R Axis:   -12 Text Interpretation:  Normal sinus rhythm Incomplete right bundle branch block Borderline ECG No significant change since last tracing Confirmed by Upton, Doreatha Martin [phone]) on 08/03/2017 6:36:02 PM       ____________________________________________  RADIOLOGY  Dg Chest 2 View  Result Date: 08/03/2017 CLINICAL DATA:  Severe mid chest pain with difficulty breathing since yesterday. Pain radiates to the neck bilaterally. Severe headache and confusion. History of hypertension and gastroesophageal reflux disease. EXAM: CHEST  2 VIEW COMPARISON:  05/29/2016 FINDINGS: Shallow inspiration with linear fibrosis or atelectasis in the left lung base similar previous study. No consolidation or airspace disease. Heart size and pulmonary vascularity are normal. No blunting of costophrenic angles. No pneumothorax. Mediastinal contours appear intact. Degenerative changes in the spine. Spinal stimulator leads projected over the midthoracic region. Old resection or resorption of the distal right clavicle. IMPRESSION: Linear fibrosis or atelectasis in the left lung base similar to previous study. No evidence of active pulmonary disease. Electronically Signed   By: Burman Nieves M.D.   On: 08/03/2017 18:57   Ct  Angio Chest/abd/pel For Dissection W And/or Wo Contrast  Result Date: 08/03/2017 CLINICAL DATA:  Severe chest pain with difficulty breathing since yesterday. EXAM: CT ANGIOGRAPHY CHEST, ABDOMEN AND PELVIS TECHNIQUE: Multidetector CT imaging through the chest, abdomen and pelvis was performed using the standard protocol during bolus administration of intravenous contrast. Multiplanar reconstructed images and MIPs were obtained and reviewed to evaluate the vascular anatomy. CONTRAST:  ISOVUE-370 IOPAMIDOL (ISOVUE-370) INJECTION 76% COMPARISON:  Abdomen pelvis CT 05/29/2016 FINDINGS: CTA CHEST FINDINGS Cardiovascular: Preferential opacification of the thoracic aorta. No evidence of thoracic aortic aneurysm or dissection. Normal heart size. No pericardial effusion. Limited opacification of the pulmonary arteries. Mediastinum/Nodes: Negative for adenopathy or mass. Small sliding hiatal hernia. Lungs/Pleura: Generalized borderline bronchiectasis. There is no edema, consolidation, effusion, or pneumothorax. Musculoskeletal: Dorsal column stimulator at T7 and T8. Generalized thoracic spondylosis. There is a posterior right eleventh and twelfth rib fractures with callus consistent with subacute timing and healing. Mild lateral left eighth rib partial resection. Review of the MIP images confirms the above findings. CTA ABDOMEN AND PELVIS FINDINGS VASCULAR Aorta: Normal caliber aorta  without aneurysm, dissection, vasculitis or significant stenosis. Celiac: Patent without evidence of aneurysm, dissection, vasculitis or significant stenosis. SMA: Patent without evidence of aneurysm, dissection, vasculitis or significant stenosis. Renals: Solitary bilateral renal arteries. Vessels are smooth and widely patent. IMA: Patent without evidence of aneurysm, dissection, vasculitis or significant stenosis. Inflow: Patent without evidence of aneurysm, dissection, vasculitis or significant stenosis. Veins: No obvious venous  abnormality within the limitations of this arterial phase study. Review of the MIP images confirms the above findings. NON-VASCULAR Hepatobiliary: Small incidental perfusion anomaly in subcapsular segment 6. Small right liver cystic densities.No evidence of biliary obstruction or stone. Pancreas: Unremarkable. Spleen: Unremarkable. Adrenals/Urinary Tract: Negative adrenals. No hydronephrosis or stone. Unremarkable bladder. Stomach/Bowel: No obstruction. No appendicitis. Sigmoid diverticulosis. Vascular/Lymphatic: No acute vascular abnormality. No mass or adenopathy. Reproductive:No pathologic findings. Other: No ascites or pneumoperitoneum.  Fatty left inguinal hernia. Musculoskeletal: Healing right L1 through L3 transverse process fractures. Severe disc and facet degeneration at L3-4 and L4-5. L5-S1 posterior-lateral fusion. There is a low-density collection along the deep fascia of the upper right gluteal musculature and right flank, up to 2.5 cm in thickness. The surrounding fat does not appear infiltrated or acutely inflamed. Review of the MIP images confirms the above findings. IMPRESSION: 1. Negative for aortic dissection. No noted atheromatous changes or stenosis. 2. Low-density collection along the right gluteal musculature and flank fascia that is new from 05/29/2016, presumed hemolymphatic collection after an interval injury. There are also healing fractures of the posterior right eleventh and twelfth ribs and L1 through L3 right transverse processes. 3. Small sliding hiatal hernia. 4. Colonic diverticulosis. Electronically Signed   By: Marnee SpringJonathon  Watts M.D.   On: 08/03/2017 20:43    ____________________________________________   INITIAL IMPRESSION / ASSESSMENT AND PLAN / ED COURSE  Concern for PE vs dissection vs pneumonia vs ACS. Will eval for same.   Workup negative. Consistently pain free after fentanyl. Delta troponin negative making acs/UA unlikely. Possibly GI related? Either way will  advise pcp follow up and strict return precautions.   Pertinent labs & imaging results that were available during my care of the patient were reviewed by me and considered in my medical decision making (see chart for details).  ____________________________________________  FINAL CLINICAL IMPRESSION(S) / ED DIAGNOSES  Final diagnoses:  Nonspecific chest pain     MEDICATIONS GIVEN DURING THIS VISIT:  Medications  aspirin chewable tablet 324 mg (324 mg Oral Not Given 08/03/17 1944)  iopamidol (ISOVUE-370) 76 % injection 120 mL (120 mLs Intravenous Contrast Given 08/03/17 2008)  fentaNYL (SUBLIMAZE) injection 100 mcg (100 mcg Intravenous Given 08/03/17 1939)     NEW OUTPATIENT MEDICATIONS STARTED DURING THIS VISIT:  This SmartLink is deprecated. Use AVSMEDLIST instead to display the medication list for a patient.  Note:  This note was prepared with assistance of Dragon voice recognition software. Occasional wrong-word or sound-a-like substitutions may have occurred due to the inherent limitations of voice recognition software.   Clinton Wahlberg, Barbara CowerJason, MD 08/04/17 [phone]

## 2018-12-12 ENCOUNTER — Other Ambulatory Visit: Payer: Self-pay | Admitting: *Deleted
